# Patient Record
Sex: Female | Born: 1955
Health system: Southern US, Community
[De-identification: ages and names within clinical notes are randomized; demographics above are authoritative.]

## PROBLEM LIST (undated history)

## (undated) DIAGNOSIS — I10 Essential (primary) hypertension: Secondary | ICD-10-CM

## (undated) HISTORY — PX: ABDOMINAL HYSTERECTOMY: SHX81

---

## 1998-03-18 ENCOUNTER — Emergency Department (HOSPITAL_COMMUNITY): Admission: EM | Admit: 1998-03-18 | Discharge: 1998-03-18 | Payer: Self-pay | Admitting: Emergency Medicine

## 1998-12-27 ENCOUNTER — Emergency Department (HOSPITAL_COMMUNITY): Admission: EM | Admit: 1998-12-27 | Discharge: 1998-12-27 | Payer: Self-pay | Admitting: Emergency Medicine

## 2001-05-23 ENCOUNTER — Emergency Department (HOSPITAL_COMMUNITY): Admission: EM | Admit: 2001-05-23 | Discharge: 2001-05-23 | Payer: Self-pay | Admitting: Emergency Medicine

## 2001-05-23 ENCOUNTER — Encounter: Payer: Self-pay | Admitting: Emergency Medicine

## 2003-05-11 ENCOUNTER — Emergency Department (HOSPITAL_COMMUNITY): Admission: EM | Admit: 2003-05-11 | Discharge: 2003-05-11 | Payer: Self-pay | Admitting: Emergency Medicine

## 2004-07-01 ENCOUNTER — Encounter: Admission: RE | Admit: 2004-07-01 | Discharge: 2004-07-01 | Payer: Self-pay | Admitting: Occupational Medicine

## 2004-07-17 ENCOUNTER — Other Ambulatory Visit: Admission: RE | Admit: 2004-07-17 | Discharge: 2004-07-17 | Payer: Self-pay | Admitting: Family Medicine

## 2006-01-26 ENCOUNTER — Other Ambulatory Visit: Admission: RE | Admit: 2006-01-26 | Discharge: 2006-01-26 | Payer: Self-pay | Admitting: Family Medicine

## 2006-05-10 ENCOUNTER — Emergency Department (HOSPITAL_COMMUNITY): Admission: EM | Admit: 2006-05-10 | Discharge: 2006-05-10 | Payer: Self-pay | Admitting: Emergency Medicine

## 2007-01-21 ENCOUNTER — Emergency Department (HOSPITAL_COMMUNITY): Admission: EM | Admit: 2007-01-21 | Discharge: 2007-01-21 | Payer: Self-pay | Admitting: Emergency Medicine

## 2007-06-06 ENCOUNTER — Emergency Department (HOSPITAL_COMMUNITY): Admission: EM | Admit: 2007-06-06 | Discharge: 2007-06-06 | Payer: Self-pay | Admitting: Family Medicine

## 2008-11-10 ENCOUNTER — Emergency Department (HOSPITAL_COMMUNITY): Admission: EM | Admit: 2008-11-10 | Discharge: 2008-11-10 | Payer: Self-pay | Admitting: Emergency Medicine

## 2009-03-20 ENCOUNTER — Other Ambulatory Visit: Admission: RE | Admit: 2009-03-20 | Discharge: 2009-03-20 | Payer: Self-pay | Admitting: Internal Medicine

## 2009-04-21 ENCOUNTER — Observation Stay (HOSPITAL_COMMUNITY): Admission: EM | Admit: 2009-04-21 | Discharge: 2009-04-21 | Payer: Self-pay | Admitting: Emergency Medicine

## 2009-08-02 ENCOUNTER — Encounter: Admission: RE | Admit: 2009-08-02 | Discharge: 2009-08-02 | Payer: Self-pay | Admitting: Internal Medicine

## 2009-10-08 ENCOUNTER — Emergency Department (HOSPITAL_COMMUNITY): Admission: EM | Admit: 2009-10-08 | Discharge: 2009-10-08 | Payer: Self-pay | Admitting: Emergency Medicine

## 2011-05-28 ENCOUNTER — Emergency Department (HOSPITAL_COMMUNITY)
Admission: EM | Admit: 2011-05-28 | Discharge: 2011-05-28 | Disposition: A | Discharge status: Home / Self Care | Payer: BC Managed Care – PPO | Attending: Emergency Medicine | Admitting: Emergency Medicine

## 2011-05-28 ENCOUNTER — Encounter (HOSPITAL_COMMUNITY): Payer: Self-pay

## 2011-05-28 DIAGNOSIS — R221 Localized swelling, mass and lump, neck: Secondary | ICD-10-CM | POA: Insufficient documentation

## 2011-05-28 DIAGNOSIS — R1310 Dysphagia, unspecified: Secondary | ICD-10-CM | POA: Insufficient documentation

## 2011-05-28 DIAGNOSIS — R220 Localized swelling, mass and lump, head: Secondary | ICD-10-CM | POA: Insufficient documentation

## 2011-05-28 DIAGNOSIS — T783XXA Angioneurotic edema, initial encounter: Secondary | ICD-10-CM | POA: Insufficient documentation

## 2011-05-28 DIAGNOSIS — T50995A Adverse effect of other drugs, medicaments and biological substances, initial encounter: Secondary | ICD-10-CM | POA: Insufficient documentation

## 2011-05-28 DIAGNOSIS — I10 Essential (primary) hypertension: Secondary | ICD-10-CM | POA: Insufficient documentation

## 2011-05-28 HISTORY — DX: Essential (primary) hypertension: I10

## 2011-05-28 MED ORDER — DIPHENHYDRAMINE HCL 50 MG/ML IJ SOLN
25.0000 mg | Freq: Once | INTRAMUSCULAR | Status: AC
  Administered 2011-05-28: 25 mg via INTRAVENOUS
  Filled 2011-05-28: qty 1

## 2011-05-28 MED ORDER — FAMOTIDINE 20 MG PO TABS: ORAL_TABLET | ORAL | Status: DC

## 2011-05-28 MED ORDER — PREDNISONE 20 MG PO TABS: 60.0000 mg | ORAL_TABLET | Freq: Every day | ORAL | Status: AC

## 2011-05-28 MED ORDER — DIPHENHYDRAMINE HCL 25 MG PO CAPS: ORAL_CAPSULE | ORAL | Status: DC

## 2011-05-28 MED ORDER — SODIUM CHLORIDE 0.9 % IV SOLN
Freq: Once | INTRAVENOUS | Status: AC
  Administered 2011-05-28: 08:00:00 via INTRAVENOUS

## 2011-05-28 MED ORDER — METHYLPREDNISOLONE SODIUM SUCC 125 MG IJ SOLR
125.0000 mg | Freq: Once | INTRAMUSCULAR | Status: AC
  Administered 2011-05-28: 125 mg via INTRAVENOUS
  Filled 2011-05-28: qty 2

## 2011-05-28 MED ORDER — FAMOTIDINE IN NACL 20-0.9 MG/50ML-% IV SOLN
20.0000 mg | Freq: Once | INTRAVENOUS | Status: AC
  Administered 2011-05-28: 20 mg via INTRAVENOUS
  Filled 2011-05-28: qty 50

## 2011-05-28 NOTE — ED Notes (Signed)
Pt reports woke up this morning at 0430 with angioedema. Reports taking benadryl with no relief. Denying any trouble breathing. Reporting some difficulty swallowing, has spit cup in her hand. Taking bp medication but doesn't remember what kind. Reports angioedema has happened 4 times before. Pt talking responding appropriately. Maintaining oxygen saturation. Airway intact.

## 2011-05-28 NOTE — ED Provider Notes (Signed)
History     CSN: [id number]  Arrival date & time 05/28/11  0719   First MD Initiated Contact with Patient 05/28/11 [phone]      Chief Complaint  Patient presents with  . Angioedema    (Consider location/radiation/quality/duration/timing/severity/associated sxs/prior treatment) Patient is a 56 y.o. female presenting with allergic reaction. The history is provided by the patient.  Allergic Reaction The primary symptoms are  angioedema. The primary symptoms do not include wheezing, shortness of breath, vomiting, rash or urticaria. The current episode started 3 to 5 hours ago.  The angioedema has been gradually worsening since its onset. It is located on the tongue. The angioedema is not associated with shortness of breath or stridor.   Associated symptoms comments: She reports history of multiple episodes of tongue swelling that is usually medication related. She denies ACE inhibitor use, but reports allergy to ibuprofen with previous tongue swelling. No SOB. She is currently spitting saliva into cup because of difficulty swallowing. No difficulty talking or breathing..    Past Medical History  Diagnosis Date  . Hypertension     History reviewed. No pertinent past surgical history.  No family history on file.  History  Substance Use Topics  . Smoking status: Never Smoker   . Smokeless tobacco: Not on file  . Alcohol Use: No    OB History    Grav Para Term Preterm Abortions TAB SAB Ect Mult Living                  Review of Systems  Constitutional: Negative for fever and chills.  HENT: Positive for trouble swallowing. Negative for voice change.        C/O tongue swelling.  Respiratory: Negative.  Negative for shortness of breath, wheezing and stridor.   Cardiovascular: Negative.   Gastrointestinal: Negative.  Negative for vomiting.  Musculoskeletal: Negative.   Skin: Negative.  Negative for rash.  Neurological: Negative.     Allergies  Ibuprofen  Home Medications     Current Outpatient Rx  Name Route Sig Dispense Refill  . DIPHENHYDRAMINE HCL 25 MG PO TABS Oral Take 25 mg by mouth every 6 (six) hours as needed. For allergic reaction      BP 161/98  Pulse 70  Temp(Src) 98.5 F (36.9 C) (Oral)  Resp 20  SpO2 96%  Physical Exam  Constitutional: She appears well-developed and well-nourished.  HENT:  Head: Normocephalic.  Mouth/Throat: Uvula is midline and mucous membranes are normal. Mucous membranes are not dry.       Tongue swelling, left side greater than right.  Neck: Normal range of motion. Neck supple.  Cardiovascular: Normal rate and regular rhythm.   Pulmonary/Chest: Effort normal and breath sounds normal. She has no wheezes. She has no rales.       No stridor.  Abdominal: Soft. Bowel sounds are normal. There is no tenderness. There is no rebound and no guarding.  Musculoskeletal: Normal range of motion.  Neurological: She is alert. No cranial nerve deficit.  Skin: Skin is warm and dry. No rash noted.  Psychiatric: She has a normal mood and affect.    ED Course  Procedures (including critical care time)  Labs Reviewed - No data to display No results found.   No diagnosis found.    MDM  The patient took Coricidin last night. She has been advised to list as an allergy. She has improved here with almost complete resolution of swelling.         Melvenia Beam  Sueanne Margarita, PA-C 05/28/11 [phone]

## 2011-05-28 NOTE — ED Provider Notes (Signed)
Medical screening examination/treatment/procedure(s) were conducted as a shared visit with non-physician practitioner(s) and myself.  I personally evaluated the patient during the encounter  Pt with angioedema of the tongue.  She states this has happened four times in the past.  She states she was previously told it was from medications however she has not had any formal allergy type testing.  She denies family history of same.  No complaints of wheezing, shortness of breath.  Lungs CTA. Angioedema of tongue.  Posterior pharynx norma.  No swelling externally.  Will treat with steroids, antihistamines and reassess.  Celene Kras, MD 05/28/11 [phone]

## 2011-05-28 NOTE — ED Notes (Signed)
Pt presents with onset of swelling to tongue since 0430 this morning.  Pt reports she is able to handle secretions, denies any lip swelling.

## 2011-05-28 NOTE — Discharge Instructions (Signed)
CALL DR. SHARMA TO ARRANGE AN APPOINTMENT FOR FURTHER EVALUATION AND MANAGEMENT OF RECURRENT ALLERGIC TYPE REACTION. RETURN HERE AS NEEDED FOR SIMILAR OR WORSENING SYMPTOMS. TAKE MEDICATIONS AS PRESCRIBED.  Angioedema Angioedema (AE) is sudden puffiness (swelling) of the skin. It can happen to the face, genitals, and other body parts. You may be reacting to something you are sensitive to (allergic reaction). It may have been passed to you from your parents (hereditary), or it may develop by itself (acquired). You may also get red itchy patches of skin (hives). Attacks may be mild. Some attacks are life-threatening. Most often, the puffiness happens fast. It often gets better in 24 to 48 hours.  HOME CARE  Carry your emergency allergy medicines with you.   Wear a medical bracelet.   Avoid things that you know will cause this reaction (triggers).  GET HELP RIGHT AWAY IF:   You have trouble breathing.   You have trouble swallowing.   You pass out (faint).   You have another attack.   Your attacks happen more often or get worse.   AE was passed to you by your parents and you want to start having children.  MAKE SURE YOU:   Understand these instructions.   Will watch your condition.   Will get help right away if you are not doing well or get worse.  Document Released: 02/25/2009 Document Revised: 02/26/2011 Document Reviewed: 02/25/2009 [facility] Patient Information 2012 [place], [place].

## 2011-06-19 ENCOUNTER — Ambulatory Visit
Admission: RE | Admit: 2011-06-19 | Discharge: 2011-06-19 | Disposition: A | Discharge status: Home / Self Care | Payer: BC Managed Care – PPO | Source: Ambulatory Visit | Attending: Allergy | Admitting: Allergy

## 2011-06-19 ENCOUNTER — Other Ambulatory Visit: Payer: Self-pay | Admitting: Allergy

## 2011-06-19 DIAGNOSIS — J45909 Unspecified asthma, uncomplicated: Secondary | ICD-10-CM

## 2011-10-02 ENCOUNTER — Emergency Department (HOSPITAL_COMMUNITY)
Admission: EM | Admit: 2011-10-02 | Discharge: 2011-10-02 | Disposition: A | Discharge status: Home / Self Care | Payer: BC Managed Care – PPO | Source: Home / Self Care

## 2011-10-02 ENCOUNTER — Encounter (HOSPITAL_COMMUNITY): Payer: Self-pay | Admitting: Emergency Medicine

## 2011-10-02 DIAGNOSIS — T783XXA Angioneurotic edema, initial encounter: Secondary | ICD-10-CM

## 2011-10-02 MED ORDER — EPINEPHRINE 0.3 MG/0.3ML IJ DEVI: 0.3000 mg | Freq: Once | INTRAMUSCULAR | Status: DC

## 2011-10-02 MED ORDER — TRIAMCINOLONE ACETONIDE 40 MG/ML IJ SUSP
INTRAMUSCULAR | Status: AC
  Filled 2011-10-02: qty 5

## 2011-10-02 MED ORDER — PREDNISONE 50 MG PO TABS: ORAL_TABLET | ORAL | Status: AC

## 2011-10-02 MED ORDER — RANITIDINE HCL 150 MG PO TABS: 150.0000 mg | ORAL_TABLET | Freq: Two times a day (BID) | ORAL | Status: DC

## 2011-10-02 MED ORDER — KETOROLAC TROMETHAMINE 60 MG/2ML IM SOLN
INTRAMUSCULAR | Status: AC
  Filled 2011-10-02: qty 2

## 2011-10-02 MED ORDER — TRIAMCINOLONE ACETONIDE 40 MG/ML IJ SUSP
60.0000 mg | Freq: Once | INTRAMUSCULAR | Status: AC
  Administered 2011-10-02: 60 mg via INTRAMUSCULAR

## 2011-10-02 NOTE — ED Notes (Signed)
PT HERE WITH SUDDEN OVERNIGHT UPPER LIP SWELLING WITH ITCH.NO NEW FOOD EATEN OR KNOWN ALLERGY.POSS BITE.DENIES PAIN.TRIED USING ICE BUT MADE WORSE IN SWELLING

## 2011-10-02 NOTE — ED Provider Notes (Signed)
History     CSN: [id number]  Arrival date & time 10/02/11  1433   None     Chief Complaint  Patient presents with  . Oral Swelling    (Consider location/radiation/quality/duration/timing/severity/associated sxs/prior treatment) The history is provided by the patient.  patient reports one week ago after consuming drink from [employer] she began to have some throat itching.  States she immediately took benadryl for symptoms in addition to ranitidine.  State this morning she woke up with swelling of her upper lip associated of localized burning sensation and despite continuing to take benadryl/ranitidine combo lip is getting progressively worse.  -feelings of throat closing, -trouble breathing, -voice changes, -abdominal pain, -rash.  Reports she has had same recurrent episodes occur within the last three years, follow up with allergist revealed dust mite allergens only.  Denies new medications, new personal hygiene products, no new foods/drinks, has not been in a new environment.   Past Medical History  Diagnosis Date  . Hypertension   . Asthma     History reviewed. No pertinent past surgical history.  No family history on file.  History  Substance Use Topics  . Smoking status: Never Smoker   . Smokeless tobacco: Not on file  . Alcohol Use: No    OB History    Grav Para Term Preterm Abortions TAB SAB Ect Mult Living                  Review of Systems  All other systems reviewed and are negative.    Allergies  Ibuprofen  Home Medications   Current Outpatient Rx  Name Route Sig Dispense Refill  . AMLODIPINE BESYLATE 5 MG PO TABS Oral Take 5 mg by mouth daily.    [name] HYDROCHLOROTHIAZIDE 12.5 MG PO TABS Oral Take 12.5 mg by mouth daily.    [name] PRAVASTATIN SODIUM 40 MG PO TABS Oral Take 40 mg by mouth daily.    [name] DIPHENHYDRAMINE HCL 25 MG PO CAPS  Take 25 mg every 4 hours for 3 days then as needed for recurrent symptoms. 30 capsule 0  . DIPHENHYDRAMINE HCL 25 MG PO TABS  Oral Take 25 mg by mouth every 6 (six) hours as needed. For allergic reaction    . FAMOTIDINE 20 MG PO TABS  Use one tablet twice daily for 3 days then as needed for recurrent swelling. 30 tablet 0  . PREDNISONE 50 MG PO TABS  Take one tab by mouth daily 5 tablet 0  . RANITIDINE HCL 150 MG PO TABS Oral Take 1 tablet (150 mg total) by mouth 2 (two) times daily. 60 tablet 1    BP 164/84  Pulse 76  Temp 98.5 F (36.9 C) (Oral)  Resp 14  SpO2 97%  Physical Exam  Nursing note and vitals reviewed. Constitutional: She is oriented to person, place, and time. Vital signs are normal. She appears well-developed and well-nourished. She is active and cooperative.  HENT:  Head: Normocephalic.  Right Ear: Hearing, tympanic membrane, external ear and ear canal normal.  Left Ear: Hearing, tympanic membrane, external ear and ear canal normal.  Nose: Nose normal.  Mouth/Throat: Uvula is midline, oropharynx is clear and moist and mucous membranes are normal.       Upper lip angioedema  Eyes: Conjunctivae and EOM are normal. Pupils are equal, round, and reactive to light. No scleral icterus.  Neck: Trachea normal and normal range of motion. Neck supple.  Cardiovascular: Normal rate, regular rhythm, normal heart sounds and  normal pulses.   Pulmonary/Chest: Effort normal and breath sounds normal.  Abdominal: Normal appearance and bowel sounds are normal. There is no hepatosplenomegaly. There is no tenderness.  Lymphadenopathy:    She has no cervical adenopathy.  Neurological: She is alert and oriented to person, place, and time. She has normal strength. No cranial nerve deficit or sensory deficit. GCS eye subscore is 4. GCS verbal subscore is 5. GCS motor subscore is 6.  Skin: Skin is warm and dry. No rash noted.  Psychiatric: She has a normal mood and affect. Her speech is normal and behavior is normal. Judgment and thought content normal. Cognition and memory are normal.    ED Course  Procedures  (including critical care time)  Labs Reviewed - No data to display No results found.   1. Angioedema of lips       MDM  Zyrtec daily in addition to medications as prescribed.  You may need additional testing that will determine if you have a condition that will predispose you similar episodes in the future.   EpiPen as needed with prompt call to EMS.            Johnsie Kindred, NP 10/02/11 1732

## 2011-10-03 NOTE — ED Provider Notes (Signed)
Medical screening examination/treatment/procedure(s) were performed by non-physician practitioner and as supervising physician I was immediately available for consultation/collaboration.  Luiz Blare MD   Luiz Blare, MD 10/03/11 [phone]

## 2011-10-18 ENCOUNTER — Encounter (HOSPITAL_COMMUNITY): Payer: Self-pay | Admitting: *Deleted

## 2011-10-18 ENCOUNTER — Observation Stay (HOSPITAL_COMMUNITY)
Admission: EM | Admit: 2011-10-18 | Discharge: 2011-10-18 | Disposition: A | Discharge status: Home / Self Care | Payer: BC Managed Care – PPO | Attending: Emergency Medicine | Admitting: Emergency Medicine

## 2011-10-18 DIAGNOSIS — R220 Localized swelling, mass and lump, head: Principal | ICD-10-CM | POA: Insufficient documentation

## 2011-10-18 DIAGNOSIS — I10 Essential (primary) hypertension: Secondary | ICD-10-CM | POA: Insufficient documentation

## 2011-10-18 DIAGNOSIS — J45909 Unspecified asthma, uncomplicated: Secondary | ICD-10-CM | POA: Insufficient documentation

## 2011-10-18 LAB — CBC
MCHC: 32.3 g/dL (ref 30.0–36.0)
RDW: 13.4 % (ref 11.5–15.5)

## 2011-10-18 LAB — BASIC METABOLIC PANEL
GFR calc Af Amer: >90 mL/min (ref >90)
GFR calc non Af Amer: >90 mL/min (ref >90)
Potassium: 3.1 mEq/L — ABNORMAL LOW (ref 3.5–5.1)
Sodium: 142 mEq/L (ref 135–145)

## 2011-10-18 MED ORDER — ONDANSETRON HCL 4 MG/2ML IJ SOLN: 4.0000 mg | Freq: Four times a day (QID) | INTRAMUSCULAR | Status: DC | PRN

## 2011-10-18 MED ORDER — DIPHENHYDRAMINE HCL 50 MG/ML IJ SOLN: 25.0000 mg | Freq: Four times a day (QID) | INTRAMUSCULAR | Status: DC | PRN

## 2011-10-18 MED ORDER — METHYLPREDNISOLONE SODIUM SUCC 40 MG IJ SOLR
40.0000 mg | Freq: Four times a day (QID) | INTRAMUSCULAR | Status: DC
  Administered 2011-10-18: 40 mg via INTRAVENOUS
  Filled 2011-10-18: qty 1

## 2011-10-18 MED ORDER — FAMOTIDINE IN NACL 20-0.9 MG/50ML-% IV SOLN
20.0000 mg | Freq: Once | INTRAVENOUS | Status: AC
  Administered 2011-10-18: 20 mg via INTRAVENOUS
  Filled 2011-10-18: qty 50

## 2011-10-18 MED ORDER — PREDNISONE 20 MG PO TABS: 40.0000 mg | ORAL_TABLET | Freq: Every day | ORAL | Status: AC

## 2011-10-18 MED ORDER — EPINEPHRINE 0.3 MG/0.3ML IJ DEVI
0.3000 mg | Freq: Once | INTRAMUSCULAR | Status: AC
  Administered 2011-10-18: 0.3 mg via INTRAMUSCULAR
  Filled 2011-10-18: qty 0.3

## 2011-10-18 MED ORDER — DIPHENHYDRAMINE HCL 25 MG PO TABS: 25.0000 mg | ORAL_TABLET | Freq: Four times a day (QID) | ORAL | Status: AC

## 2011-10-18 MED ORDER — DIPHENHYDRAMINE HCL 50 MG/ML IJ SOLN
12.5000 mg | Freq: Once | INTRAMUSCULAR | Status: AC
  Administered 2011-10-18: 12.5 mg via INTRAVENOUS
  Filled 2011-10-18: qty 1

## 2011-10-18 MED ORDER — METHYLPREDNISOLONE SODIUM SUCC 125 MG IJ SOLR
125.0000 mg | Freq: Once | INTRAMUSCULAR | Status: AC
  Administered 2011-10-18: 125 mg via INTRAVENOUS
  Filled 2011-10-18: qty 2

## 2011-10-18 MED ORDER — FAMOTIDINE IN NACL 20-0.9 MG/50ML-% IV SOLN
20.0000 mg | Freq: Two times a day (BID) | INTRAVENOUS | Status: DC
  Administered 2011-10-18: 20 mg via INTRAVENOUS
  Filled 2011-10-18: qty 50

## 2011-10-18 NOTE — ED Provider Notes (Signed)
History     CSN: [id number]  Arrival date & time 10/18/11  0151   First MD Initiated Contact with Patient 10/18/11 0256      Chief Complaint  Patient presents with  . Oral Swelling    (Consider location/radiation/quality/duration/timing/severity/associated sxs/prior treatment) HPI  Past Medical History  Diagnosis Date  . Hypertension   . Asthma     History reviewed. No pertinent past surgical history.  History reviewed. No pertinent family history.  History  Substance Use Topics  . Smoking status: Never Smoker   . Smokeless tobacco: Not on file  . Alcohol Use: No    OB History    Grav Para Term Preterm Abortions TAB SAB Ect Mult Living                  Review of Systems  Allergies  Ibuprofen  Home Medications   Current Outpatient Rx  Name Route Sig Dispense Refill  . AMLODIPINE BESYLATE 5 MG PO TABS Oral Take 5 mg by mouth daily.    [name] DIPHENHYDRAMINE HCL 25 MG PO TABS Oral Take 25 mg by mouth every 6 (six) hours as needed. For allergic reaction    . FAMOTIDINE 20 MG PO TABS  Use one tablet twice daily for 3 days then as needed for recurrent swelling. 30 tablet 0  . HYDROCHLOROTHIAZIDE 12.5 MG PO TABS Oral Take 12.5 mg by mouth daily.    [name] PRAVASTATIN SODIUM 40 MG PO TABS Oral Take 40 mg by mouth daily.    [name] RANITIDINE HCL 150 MG PO TABS Oral Take 1 tablet (150 mg total) by mouth 2 (two) times daily. 60 tablet 1  . EPINEPHRINE 0.3 MG/0.3ML IJ DEVI Intramuscular Inject 0.3 mLs (0.3 mg total) into the muscle once. 1 Device 2    BP 146/78  Pulse 104  Temp 97.9 F (36.6 C) (Oral)  Resp 18  SpO2 100%  Physical Exam  ED Course  Procedures (including critical care time)  Labs Reviewed  CBC - Abnormal; Notable for the following:    WBC 11.3 (*)     All other components within normal limits  BASIC METABOLIC PANEL - Abnormal; Notable for the following:    Potassium 3.1 (*)     Glucose, Bld 140 (*)     All other components within normal limits   No  results found.   1. Tongue swelling       MDM  Received sign-over at 0800.  Pt had swelling/edema isolated to her tongue.  There was no evidence of airway compromise.  Over the last 3 hours the swelling has improved.  She appears comfortable and has normal respiratory effort and is able to swallow without difficulty.  Plan d/c home.  Pt has been advised to avoid [employer] which appears to be the offending agent.  Will also prescribe prn benadryl and a prednisone pulse.  Strongly encouraged close outpt f/u.        Tobin Chad, MD 10/18/11 1101

## 2011-10-18 NOTE — ED Notes (Signed)
Pt states that she ate a hamburger and cinniman roll this evening. Pt went to sleep then woke up with tonge swelling.

## 2011-10-18 NOTE — ED Provider Notes (Signed)
History     CSN: [id number]  Arrival date & time 10/18/11  0151   First MD Initiated Contact with Patient 10/18/11 0256     3:44 AM HPI Patient reports this evening she is a hamburger from [employer] and a cinnamon bun. Reports going to sleep and waking up with swelling of her tongue. Denies difficulty breathing, shortness breath, lip swelling. Denies change in medication. Reports taking a Benadryl feeling as if symptoms are worsened. The greatest patient had similar symptoms earlier this month after having [employer] food. Patient to followup with allergist and patient has no other allergies except from dust mites.   The history is provided by the patient.    Past Medical History  Diagnosis Date  . Hypertension   . Asthma     History reviewed. No pertinent past surgical history.  History reviewed. No pertinent family history.  History  Substance Use Topics  . Smoking status: Never Smoker   . Smokeless tobacco: Not on file  . Alcohol Use: No    OB History    Grav Para Term Preterm Abortions TAB SAB Ect Mult Living                  Review of Systems  Constitutional: Negative for fever, chills and fatigue.  HENT: Positive for trouble swallowing. Negative for sore throat, mouth sores, neck pain and neck stiffness.        Tongue swelling  Respiratory: Negative for cough, shortness of breath and wheezing.   Cardiovascular: Negative for chest pain.  All other systems reviewed and are negative.    Allergies  Ibuprofen  Home Medications   Current Outpatient Rx  Name Route Sig Dispense Refill  . AMLODIPINE BESYLATE 5 MG PO TABS Oral Take 5 mg by mouth daily.    [name] DIPHENHYDRAMINE HCL 25 MG PO TABS Oral Take 25 mg by mouth every 6 (six) hours as needed. For allergic reaction    . FAMOTIDINE 20 MG PO TABS  Use one tablet twice daily for 3 days then as needed for recurrent swelling. 30 tablet 0  . HYDROCHLOROTHIAZIDE 12.5 MG PO TABS Oral Take 12.5 mg by mouth daily.    [name]  PRAVASTATIN SODIUM 40 MG PO TABS Oral Take 40 mg by mouth daily.    [name] RANITIDINE HCL 150 MG PO TABS Oral Take 1 tablet (150 mg total) by mouth 2 (two) times daily. 60 tablet 1  . EPINEPHRINE 0.3 MG/0.3ML IJ DEVI Intramuscular Inject 0.3 mLs (0.3 mg total) into the muscle once. 1 Device 2    BP 190/107  Pulse 94  Temp 97.9 F (36.6 C) (Oral)  Resp 14  SpO2 99%  Physical Exam  Vitals reviewed. Constitutional: She is oriented to person, place, and time. Vital signs are normal. She appears well-developed and well-nourished.  HENT:  Head: Normocephalic and atraumatic.       Patient's oropharynx is clear. Moderate amount of tongue edema. No erythema. Neck is nontender.  Eyes: Conjunctivae are normal. Pupils are equal, round, and reactive to light.  Neck: Normal range of motion. Neck supple.  Cardiovascular: Normal rate, regular rhythm and normal heart sounds.  Exam reveals no friction rub.   No murmur heard. Pulmonary/Chest: Effort normal and breath sounds normal. She has no wheezes. She has no rhonchi. She has no rales. She exhibits no tenderness.  Musculoskeletal: Normal range of motion.  Neurological: She is alert and oriented to person, place, and time. Coordination normal.  Skin: Skin is warm  and dry. No rash noted. No erythema. No pallor.    ED Course  Procedures  MDM    It has been observed for 4 hours in ED. Symptoms have not worsened or improved. Will place in CDU allergic reaction protocol. Patient does not have difficulty with breathing, throat irritation, shortness of breath, or cough. However she does have moderate amount of tongue swelling. I feel this should be watched in ED longer to ensure no worsening. Discussed patient with Dr. Gwen Her, PA-C 10/18/11 [phone]

## 2011-10-18 NOTE — ED Provider Notes (Signed)
Medical screening examination/treatment/procedure(s) were conducted as a shared visit with non-physician practitioner(s) and myself.  I personally evaluated the patient during the encounter.  Pt with h/o angioedema in the past, has seen allergist without clear cause of same.  Usually only lips, but woke around 130 am with tongue edema.  No sob, difficulties swallowing.  No progression of swelling.  To be observed in cdu on allergy protocol.  Olivia Mackie, MD 10/18/11 [phone]

## 2013-01-05 ENCOUNTER — Emergency Department (INDEPENDENT_AMBULATORY_CARE_PROVIDER_SITE_OTHER): Payer: BC Managed Care – PPO

## 2013-01-05 ENCOUNTER — Emergency Department (HOSPITAL_COMMUNITY)
Admission: EM | Admit: 2013-01-05 | Discharge: 2013-01-05 | Disposition: A | Discharge status: Home / Self Care | Payer: BC Managed Care – PPO | Source: Home / Self Care | Attending: Family Medicine | Admitting: Family Medicine

## 2013-01-05 ENCOUNTER — Encounter (HOSPITAL_COMMUNITY): Payer: Self-pay | Admitting: Emergency Medicine

## 2013-01-05 DIAGNOSIS — J069 Acute upper respiratory infection, unspecified: Secondary | ICD-10-CM

## 2013-01-05 LAB — POCT RAPID STREP A: Streptococcus, Group A Screen (Direct): NEGATIVE

## 2013-01-05 MED ORDER — IPRATROPIUM BROMIDE 0.06 % NA SOLN: 2.0000 | Freq: Four times a day (QID) | NASAL | Status: DC

## 2013-01-05 MED ORDER — MINOCYCLINE HCL 100 MG PO CAPS: 100.0000 mg | ORAL_CAPSULE | Freq: Two times a day (BID) | ORAL | Status: DC

## 2013-01-05 NOTE — ED Provider Notes (Signed)
CSN: [id number]     Arrival date & time 01/05/13  1757 History   First MD Initiated Contact with Patient 01/05/13 1858     Chief Complaint  Patient presents with  . Sore Throat  . Cough   (Consider location/radiation/quality/duration/timing/severity/associated sxs/prior Treatment) Patient is a 57 y.o. female presenting with pharyngitis and cough. The history is provided by the patient.  Sore Throat This is a new problem. The current episode started more than 1 week ago. The problem has been gradually worsening. Associated symptoms include chest pain. Associated symptoms comments: Assoc with coughing.  Cough Associated symptoms: chest pain and rhinorrhea     Past Medical History  Diagnosis Date  . Hypertension   . Asthma    History reviewed. No pertinent past surgical history. History reviewed. No pertinent family history. History  Substance Use Topics  . Smoking status: Never Smoker   . Smokeless tobacco: Not on file  . Alcohol Use: No   OB History   Grav Para Term Preterm Abortions TAB SAB Ect Mult Living                 Review of Systems  Constitutional: Negative.   HENT: Positive for congestion, postnasal drip and rhinorrhea.   Respiratory: Positive for cough.   Cardiovascular: Positive for chest pain.  Gastrointestinal: Negative.     Allergies  Ibuprofen  Home Medications   Current Outpatient Rx  Name  Route  Sig  Dispense  Refill  . amLODipine (NORVASC) 5 MG tablet   Oral   Take 5 mg by mouth daily.         . diphenhydrAMINE (BENADRYL) 25 MG tablet   Oral   Take 25 mg by mouth every 6 (six) hours as needed. For allergic reaction         . EXPIRED: diphenhydrAMINE (BENADRYL) 25 MG tablet   Oral   Take 1 tablet (25 mg total) by mouth every 6 (six) hours.   20 tablet   0   . EPINEPHrine (EPIPEN) 0.3 mg/0.3 mL DEVI   Intramuscular   Inject 0.3 mLs (0.3 mg total) into the muscle once.   1 Device   2   . famotidine (PEPCID) 20 MG tablet     Use one tablet twice daily for 3 days then as needed for recurrent swelling.   30 tablet   0   . hydrochlorothiazide (HYDRODIURIL) 12.5 MG tablet   Oral   Take 12.5 mg by mouth daily.         [name] ipratropium (ATROVENT) 0.06 % nasal spray   Nasal   Place 2 sprays into the nose 4 (four) times daily.   15 mL   1   . minocycline (MINOCIN,DYNACIN) 100 MG capsule   Oral   Take 1 capsule (100 mg total) by mouth 2 (two) times daily.   20 capsule   0   . pravastatin (PRAVACHOL) 40 MG tablet   Oral   Take 40 mg by mouth daily.         [name] EXPIRED: ranitidine (ZANTAC) 150 MG tablet   Oral   Take 1 tablet (150 mg total) by mouth 2 (two) times daily.   60 tablet   1    BP 208/174  Pulse 81  Temp(Src) 98.8 F (37.1 C) (Oral)  Resp 16  SpO2 100% Physical Exam  Nursing note and vitals reviewed. Constitutional: She is oriented to person, place, and time. She appears well-developed and well-nourished.  HENT:  Head:  Normocephalic.  Right Ear: External ear normal.  Left Ear: External ear normal.  Nose: Mucosal edema and rhinorrhea present.  Neck: Normal range of motion. Neck supple.  Cardiovascular: Regular rhythm and normal heart sounds.   Pulmonary/Chest: Effort normal. No respiratory distress. She has no wheezes. She has rhonchi.  Abdominal: Soft. Bowel sounds are normal.  Lymphadenopathy:    She has no cervical adenopathy.  Neurological: She is alert and oriented to person, place, and time.  Skin: Skin is warm and dry.    ED Course  Procedures (including critical care time) Labs Review Labs Reviewed  POCT RAPID STREP A ([facility] CARE ONLY)   Imaging Review Dg Chest 2 View  01/05/2013   CLINICAL DATA:  57 year old female with cough and sore throat  EXAM: CHEST  2 VIEW  COMPARISON:  06/19/2011 and prior chest radiographs  FINDINGS: The cardiomediastinal silhouette is unremarkable.  Mild peribronchial thickening/ interstitial prominence is unchanged.  There is no evidence  of focal airspace disease, pulmonary edema, suspicious pulmonary nodule/mass, pleural effusion, or pneumothorax. No acute bony abnormalities are identified.  IMPRESSION: No evidence of acute cardiopulmonary disease.  Unchanged mild peribronchial thickening/ interstitial lung disease.   Electronically Signed   By: Laveda Abbe M.D.   On: 01/05/2013 19:39      MDM  X-rays reviewed and report per radiologist.     Linna Hoff, MD 01/05/13 [phone]

## 2013-01-05 NOTE — ED Notes (Signed)
C/o sore throat and chest pain due to coughing.

## 2013-01-07 LAB — CULTURE, GROUP A STREP

## 2013-05-02 ENCOUNTER — Emergency Department (HOSPITAL_COMMUNITY)
Admission: EM | Admit: 2013-05-02 | Discharge: 2013-05-02 | Disposition: A | Discharge status: Home / Self Care | Payer: BC Managed Care – PPO | Source: Home / Self Care | Attending: Family Medicine | Admitting: Family Medicine

## 2013-05-02 ENCOUNTER — Encounter (HOSPITAL_COMMUNITY): Payer: Self-pay | Admitting: Emergency Medicine

## 2013-05-02 DIAGNOSIS — L52 Erythema nodosum: Secondary | ICD-10-CM

## 2013-05-02 LAB — POCT RAPID STREP A: Streptococcus, Group A Screen (Direct): NEGATIVE

## 2013-05-02 MED ORDER — BENZONATATE 200 MG PO CAPS: 200.0000 mg | ORAL_CAPSULE | Freq: Three times a day (TID) | ORAL | Status: DC | PRN

## 2013-05-02 MED ORDER — IPRATROPIUM BROMIDE 0.06 % NA SOLN: 2.0000 | Freq: Four times a day (QID) | NASAL | Status: DC

## 2013-05-02 NOTE — ED Provider Notes (Signed)
CSN: [id number]     Arrival date & time 05/02/13  1419 History   First MD Initiated Contact with Patient 05/02/13 1623     Chief Complaint  Patient presents with  . Cough  . Rash     (Consider location/radiation/quality/duration/timing/severity/associated sxs/prior Treatment) HPI Comments: 58 year old female presents complaining of dry cough, chest congestion, back pain, runny nose. This began 4 days ago. 7 days ago she started to have a red bump that is slightly painful and tender on her right shin. This has started to resolve, but she has had 2 more red bumps show up, one on her left shoulder and one on her right forearm. They feel and look exactly the same as the original rash on her shin. She has never had anything like this happen before and has no other significant medical problems. No fever or sore throat. No chest pain or shortness of breath  Patient is a 58 y.o. female presenting with cough and rash.  Cough Associated symptoms: rash and rhinorrhea   Associated symptoms: no chest pain, no chills, no fever, no myalgias and no shortness of breath   Rash Associated symptoms: fatigue   Associated symptoms: no abdominal pain, no fever, no joint pain, no myalgias, no nausea, no shortness of breath and not vomiting     Past Medical History  Diagnosis Date  . Hypertension   . Asthma    History reviewed. No pertinent past surgical history. History reviewed. No pertinent family history. History  Substance Use Topics  . Smoking status: Never Smoker   . Smokeless tobacco: Not on file  . Alcohol Use: No   OB History   Grav Para Term Preterm Abortions TAB SAB Ect Mult Living                 Review of Systems  Constitutional: Positive for fatigue. Negative for fever and chills.  HENT: Positive for congestion and rhinorrhea.   Eyes: Negative for visual disturbance.  Respiratory: Positive for cough and chest tightness. Negative for shortness of breath.   Cardiovascular: Negative  for chest pain, palpitations and leg swelling.  Gastrointestinal: Negative for nausea, vomiting and abdominal pain.  Endocrine: Negative for polydipsia and polyuria.  Genitourinary: Negative for dysuria, urgency and frequency.  Musculoskeletal: Positive for back pain. Negative for arthralgias and myalgias.  Skin: Positive for rash.  Neurological: Negative for dizziness, weakness and light-headedness.      Allergies  Ibuprofen  Home Medications   Current Outpatient Rx  Name  Route  Sig  Dispense  Refill  . amLODipine (NORVASC) 5 MG tablet   Oral   Take 5 mg by mouth daily.         . benzonatate (TESSALON) 200 MG capsule   Oral   Take 1 capsule (200 mg total) by mouth 3 (three) times daily as needed for cough.   20 capsule   0   . diphenhydrAMINE (BENADRYL) 25 MG tablet   Oral   Take 25 mg by mouth every 6 (six) hours as needed. For allergic reaction         . EXPIRED: diphenhydrAMINE (BENADRYL) 25 MG tablet   Oral   Take 1 tablet (25 mg total) by mouth every 6 (six) hours.   20 tablet   0   . EPINEPHrine (EPIPEN) 0.3 mg/0.3 mL DEVI   Intramuscular   Inject 0.3 mLs (0.3 mg total) into the muscle once.   1 Device   2   . famotidine (PEPCID) 20  MG tablet      Use one tablet twice daily for 3 days then as needed for recurrent swelling.   30 tablet   0   . hydrochlorothiazide (HYDRODIURIL) 12.5 MG tablet   Oral   Take 12.5 mg by mouth daily.         [name] ipratropium (ATROVENT) 0.06 % nasal spray   Nasal   Place 2 sprays into the nose 4 (four) times daily.   15 mL   1   . ipratropium (ATROVENT) 0.06 % nasal spray   Each Nare   Place 2 sprays into both nostrils 4 (four) times daily.   15 mL   12   . minocycline (MINOCIN,DYNACIN) 100 MG capsule   Oral   Take 1 capsule (100 mg total) by mouth 2 (two) times daily.   20 capsule   0   . pravastatin (PRAVACHOL) 40 MG tablet   Oral   Take 40 mg by mouth daily.         [name] EXPIRED: ranitidine (ZANTAC)  150 MG tablet   Oral   Take 1 tablet (150 mg total) by mouth 2 (two) times daily.   60 tablet   1    BP 189/99  Pulse 78  Temp(Src) 98.2 F (36.8 C) (Oral)  Resp 14  SpO2 98% Physical Exam  Nursing note and vitals reviewed. Constitutional: She is oriented to person, place, and time. Vital signs are normal. She appears well-developed and well-nourished. No distress.  HENT:  Head: Normocephalic and atraumatic.  Right Ear: Tympanic membrane, external ear and ear canal normal.  Left Ear: Tympanic membrane, external ear and ear canal normal.  Nose: Nose normal. Right sinus exhibits no maxillary sinus tenderness and no frontal sinus tenderness. Left sinus exhibits no maxillary sinus tenderness and no frontal sinus tenderness.  Mouth/Throat: Uvula is midline, oropharynx is clear and moist and mucous membranes are normal. No oropharyngeal exudate.  Eyes: Conjunctivae are normal. Right eye exhibits no discharge. Left eye exhibits no discharge.  Neck: Normal range of motion. Neck supple.  Cardiovascular: Normal rate, regular rhythm and normal heart sounds.  Exam reveals no gallop and no friction rub.   No murmur heard. Pulmonary/Chest: Effort normal and breath sounds normal. No respiratory distress. She has no wheezes. She has no rales.  Lymphadenopathy:    She has no cervical adenopathy.  Neurological: She is alert and oriented to person, place, and time. She has normal strength. Coordination normal.  Skin: Skin is warm and dry. Rash noted. Rash is nodular (raised, 2-1/2 cm circular, erythematous, indurated, nodules on the right forearm, and 2 on the left shoulder. The area she described on her right shin has resolved and his left a discolored, ecchymotic area with a central brown papule). She is not diaphoretic.  Psychiatric: She has a normal mood and affect. Judgment normal.    ED Course  Procedures (including critical care time) Labs Review Labs Reviewed  POCT RAPID STREP A ([facility]  CARE ONLY)   Imaging Review No results found.    MDM   Final diagnoses:  Erythema nodosum    This rash is the appearance of erythema nodosum, although it is atypical distribution. This is most likely a self-limited upper respiratory viral syndrome. Will prescribe Atrovent nasal spray for the runny nose and Tessalon perles for cough, and advised her to stop taking any of the over-the-counter medications she was taking before. She will attempt to give this 2 weeks to resolve completely, otherwise  she will return if she has any worsening in her condition or if this does not resolve.   Meds ordered this encounter  Medications  . ipratropium (ATROVENT) 0.06 % nasal spray    Sig: Place 2 sprays into both nostrils 4 (four) times daily.    Dispense:  15 mL    Refill:  12    Order Specific Question:  Supervising Provider    Answer:  Linna Hoff [phone]  . benzonatate (TESSALON) 200 MG capsule    Sig: Take 1 capsule (200 mg total) by mouth 3 (three) times daily as needed for cough.    Dispense:  20 capsule    Refill:  0    Order Specific Question:  Supervising Provider    Answer:  Bradd Canary D [[id number]]       Graylon Good, PA-C 05/02/13 [id number]

## 2013-05-02 NOTE — ED Notes (Signed)
C/o cold sx that started 4 days ago that include chest congestion, non productive cough, and back pain. Pain is 8/10. Pt has red bumps on arms and chest that started on Sunday. Stated that a bump was on her leg 1 week ago, but went away. Written by: Marga MelnickQuaNeisha Jones, SMA

## 2013-05-02 NOTE — Discharge Instructions (Signed)
Erythema Nodosum  Erythema nodosum is also called "painful red nodules on the legs". Symptoms can include sudden onset of fever, fatigue and joint pains. Red, deep, tender, raised, bruise-like bumps form on the shin, or sometimes on the arms or trunk. The skin over the bumps is usually shiny. The symptoms usually last 1-2 weeks. The symptoms usually improve once the cause is treated. This illness may be caused by strep or fungal infection, drug reaction, pregnancy, or other medical conditions. Treating the cause is important to early recovery. Erythema nodosum occurs at any age and in both sexes but more often in young adult women.  Erythema nodosum is not a skin infection. It is a reaction to something internal. In most cases the illness and bumps go away with treatment. You should avoid any medicine that may have caused this reaction. Antihistamine drugs, anti-inflammatory drugs or cortisone medicine may be prescribed to relieve symptoms. SSKI drops ([place]) taken with juice at breakfast, lunch and dinner may have an anti-inflammatory effect and speed the healing. Bed rest and limiting vigorous exercise help to shorten the course of erythema nodosum. Elevation of the affected limb also helps in recovery.  As the condition gets better, the bumps flatten and your skin may heal with temporary bruise marks. These dark marks will clear up in several months and they are a good sign that your skin is healing.   SEEK IMMEDIATE MEDICAL CARE IF:   · Your condition worsens, or if you have more severe symptoms such a high fever, sore throat, or repeated vomiting.  Document Released: 04/16/2004 Document Revised: 06/01/2011 Document Reviewed: 04/20/2008  [employer]® Patient Information ©2014 [employer].

## 2013-05-03 NOTE — ED Provider Notes (Signed)
Medical screening examination/treatment/procedure(s) were performed by resident physician or non-physician practitioner and as supervising physician I was immediately available for consultation/collaboration.   Uriyah Massimo DOUGLAS MD.   Bular Hickok D Griselda Tosh, MD 05/03/13 1956 

## 2013-05-05 LAB — CULTURE, GROUP A STREP

## 2014-08-03 ENCOUNTER — Other Ambulatory Visit: Payer: Self-pay | Admitting: Registered Nurse

## 2014-08-03 DIAGNOSIS — Z1231 Encounter for screening mammogram for malignant neoplasm of breast: Secondary | ICD-10-CM

## 2014-08-08 ENCOUNTER — Ambulatory Visit
Admission: RE | Admit: 2014-08-08 | Discharge: 2014-08-08 | Disposition: A | Discharge status: Home / Self Care | Payer: BLUE CROSS/BLUE SHIELD | Source: Ambulatory Visit | Attending: Registered Nurse | Admitting: Registered Nurse

## 2014-08-08 DIAGNOSIS — Z1231 Encounter for screening mammogram for malignant neoplasm of breast: Secondary | ICD-10-CM

## 2015-06-24 DIAGNOSIS — H401132 Primary open-angle glaucoma, bilateral, moderate stage: Secondary | ICD-10-CM | POA: Diagnosis not present

## 2016-07-24 ENCOUNTER — Encounter (HOSPITAL_COMMUNITY): Payer: Self-pay | Admitting: Emergency Medicine

## 2016-07-24 ENCOUNTER — Emergency Department (HOSPITAL_COMMUNITY)
Admission: EM | Admit: 2016-07-24 | Discharge: 2016-07-24 | Disposition: A | Discharge status: Home / Self Care | Payer: BLUE CROSS/BLUE SHIELD | Attending: Emergency Medicine | Admitting: Emergency Medicine

## 2016-07-24 DIAGNOSIS — Z79899 Other long term (current) drug therapy: Secondary | ICD-10-CM | POA: Insufficient documentation

## 2016-07-24 DIAGNOSIS — J45909 Unspecified asthma, uncomplicated: Secondary | ICD-10-CM | POA: Diagnosis not present

## 2016-07-24 DIAGNOSIS — T7840XA Allergy, unspecified, initial encounter: Secondary | ICD-10-CM | POA: Diagnosis present

## 2016-07-24 DIAGNOSIS — R220 Localized swelling, mass and lump, head: Secondary | ICD-10-CM | POA: Diagnosis not present

## 2016-07-24 DIAGNOSIS — I10 Essential (primary) hypertension: Secondary | ICD-10-CM | POA: Insufficient documentation

## 2016-07-24 MED ORDER — PREDNISONE 10 MG PO TABS: 10.0000 mg | ORAL_TABLET | Freq: Two times a day (BID) | ORAL | 0 refills | Status: DC

## 2016-07-24 MED ORDER — METHYLPREDNISOLONE SODIUM SUCC 125 MG IJ SOLR
125.0000 mg | Freq: Once | INTRAMUSCULAR | Status: AC
  Administered 2016-07-24: 125 mg via INTRAVENOUS
  Filled 2016-07-24: qty 2

## 2016-07-24 NOTE — ED Provider Notes (Signed)
[facility]PT Provider Note   CSN: [id number] Arrival date & time: 07/24/16  5621     History   Chief Complaint Chief Complaint  Patient presents with  . Allergic Reaction  . Oral Swelling    HPI Rachael Smith is a 61 y.o. female.  Patient is a 61 year old female with history of asthma and hypertension. She presents for evaluation of tongue swelling. This started this morning and woke her from sleep. The left side of her tongue has swollen up. She denies any pain. She denies any difficulty breathing but does state that is difficult to swallow. She denies any fevers or chills. She denies any new contacts. She is not currently taking any ACE inhibitor's. She does report drinking Crystal light for the first time this evening.   The history is provided by the patient.  Allergic Reaction  Presenting symptoms: difficulty swallowing   Presenting symptoms: no difficulty breathing   Severity:  Moderate Duration:  2 hours Relieved by:  Nothing Worsened by:  Nothing Ineffective treatments:  Antihistamines   Past Medical History:  Diagnosis Date  . Asthma   . Hypertension     There are no active problems to display for this patient.   No past surgical history on file.  OB History    No data available       Home Medications    Prior to Admission medications   Medication Sig Start Date End Date Taking? Authorizing Provider  amLODipine (NORVASC) 5 MG tablet Take 5 mg by mouth daily.    Historical Provider, MD  benzonatate (TESSALON) 200 MG capsule Take 1 capsule (200 mg total) by mouth 3 (three) times daily as needed for cough. 05/02/13   Graylon Good, PA-C  diphenhydrAMINE (BENADRYL) 25 MG tablet Take 25 mg by mouth every 6 (six) hours as needed. For allergic reaction    Historical Provider, MD  diphenhydrAMINE (BENADRYL) 25 MG tablet Take 1 tablet (25 mg total) by mouth every 6 (six) hours. 10/18/11 11/17/11  Tobin Chad, MD  EPINEPHrine (EPIPEN) 0.3 mg/0.3  mL DEVI Inject 0.3 mLs (0.3 mg total) into the muscle once. 10/02/11   Johnsie Kindred, NP  famotidine (PEPCID) 20 MG tablet Use one tablet twice daily for 3 days then as needed for recurrent swelling. 05/28/11   Elpidio Anis, PA-C  hydrochlorothiazide (HYDRODIURIL) 12.5 MG tablet Take 12.5 mg by mouth daily.    Historical Provider, MD  ipratropium (ATROVENT) 0.06 % nasal spray Place 2 sprays into the nose 4 (four) times daily. 01/05/13   Linna Hoff, MD  ipratropium (ATROVENT) 0.06 % nasal spray Place 2 sprays into both nostrils 4 (four) times daily. 05/02/13   Graylon Good, PA-C  minocycline (MINOCIN,DYNACIN) 100 MG capsule Take 1 capsule (100 mg total) by mouth 2 (two) times daily. 01/05/13   Linna Hoff, MD  pravastatin (PRAVACHOL) 40 MG tablet Take 40 mg by mouth daily.    Historical Provider, MD  ranitidine (ZANTAC) 150 MG tablet Take 1 tablet (150 mg total) by mouth 2 (two) times daily. 10/02/11 10/01/12  Johnsie Kindred, NP    Family History No family history on file.  Social History Social History  Substance Use Topics  . Smoking status: Never Smoker  . Smokeless tobacco: Not on file  . Alcohol use No     Allergies   Ibuprofen   Review of Systems Review of Systems  HENT: Positive for trouble swallowing.   All other systems reviewed and  are negative.    Physical Exam Updated Vital Signs BP (!) 190/105   Pulse 79   Temp 98.5 F (36.9 C)   Resp 18   SpO2 98%   Physical Exam  Constitutional: She is oriented to person, place, and time. She appears well-developed and well-nourished. No distress.  HENT:  Head: Normocephalic and atraumatic.  Mouth/Throat: Oropharynx is clear and moist.  There is asymmetrical swelling of the tongue. The left side of the tongue is enlarged. There is no evidence for trauma. There is no stridor.  Neck: Normal range of motion. Neck supple. No tracheal deviation present.  Cardiovascular: Normal rate and regular rhythm.  Exam reveals no  gallop and no friction rub.   No murmur heard. Pulmonary/Chest: Effort normal and breath sounds normal. No stridor. No respiratory distress. She has no wheezes. She has no rales.  Abdominal: Soft. Bowel sounds are normal. She exhibits no distension. There is no tenderness.  Musculoskeletal: Normal range of motion.  Neurological: She is alert and oriented to person, place, and time.  Skin: Skin is warm and dry. She is not diaphoretic.  Nursing note and vitals reviewed.    ED Treatments / Results  Labs (all labs ordered are listed, but only abnormal results are displayed) Labs Reviewed - No data to display  EKG  EKG Interpretation None       Radiology No results found.  Procedures Procedures (including critical care time)  Medications Ordered in ED Medications  methylPREDNISolone sodium succinate (SOLU-MEDROL) 125 mg/2 mL injection 125 mg (not administered)     Initial Impression / Assessment and Plan / ED Course  I have reviewed the triage vital signs and the nursing notes.  Pertinent labs & imaging results that were available during my care of the patient were reviewed by me and considered in my medical decision making (see chart for details).  Patient presents with complaints of swelling to the left side of her tongue that started in the night. Her only new exposure would be drinking Crystal light. She does not take an ACE inhibitor. She does report having similar episodes in the past, the cause of which she is uncertain. This sounds like and looks clinically like angioedema.  She was given steroids and antihistamines and observed. Her swelling has improved and she is feeling better. She will be discharged with prednisone, Benadryl, and as needed return.  Final Clinical Impressions(s) / ED Diagnoses   Final diagnoses:  None    New Prescriptions New Prescriptions   No medications on file     Geoffery Lyonsouglas Lovie Agresta, MD 07/24/16 [phone]

## 2016-07-24 NOTE — ED Triage Notes (Addendum)
Pt reports woke up about 2 am with her tongue swollen. Pt reports used crystal light for the first time.

## 2016-07-24 NOTE — Discharge Instructions (Signed)
Prednisone as prescribed.  Benadryl 25 mg every 6 hours for the next 3 days.  Return to the emergency department if you develop difficulty breathing, worsening swelling, or an inability to swallow.

## 2016-07-29 DIAGNOSIS — H401131 Primary open-angle glaucoma, bilateral, mild stage: Secondary | ICD-10-CM | POA: Diagnosis not present

## 2016-08-02 ENCOUNTER — Emergency Department (HOSPITAL_COMMUNITY)
Admission: EM | Admit: 2016-08-02 | Discharge: 2016-08-02 | Disposition: A | Discharge status: Home / Self Care | Payer: BLUE CROSS/BLUE SHIELD | Attending: Emergency Medicine | Admitting: Emergency Medicine

## 2016-08-02 ENCOUNTER — Encounter (HOSPITAL_COMMUNITY): Payer: Self-pay | Admitting: Emergency Medicine

## 2016-08-02 DIAGNOSIS — R21 Rash and other nonspecific skin eruption: Secondary | ICD-10-CM | POA: Diagnosis present

## 2016-08-02 DIAGNOSIS — Z79899 Other long term (current) drug therapy: Secondary | ICD-10-CM | POA: Insufficient documentation

## 2016-08-02 DIAGNOSIS — I10 Essential (primary) hypertension: Secondary | ICD-10-CM | POA: Insufficient documentation

## 2016-08-02 DIAGNOSIS — J45909 Unspecified asthma, uncomplicated: Secondary | ICD-10-CM | POA: Insufficient documentation

## 2016-08-02 DIAGNOSIS — T7840XA Allergy, unspecified, initial encounter: Secondary | ICD-10-CM | POA: Diagnosis not present

## 2016-08-02 MED ORDER — PREDNISONE 10 MG PO TABS: 10.0000 mg | ORAL_TABLET | Freq: Two times a day (BID) | ORAL | 0 refills | Status: AC

## 2016-08-02 NOTE — ED Provider Notes (Signed)
[facility] Provider Note   CSN: [id number] Arrival date & time: 08/02/16  9562     History   Chief Complaint Chief Complaint  Patient presents with  . Allergic Reaction    HPI Rachael Smith is a 61 y.o. female.  The history is provided by the patient.  Rash   This is a recurrent problem. The current episode started 6 to 12 hours ago. The problem has been resolved. The problem is associated with an unknown factor. There has been no fever. The fever has been present for less than 1 day. The rash is present on the trunk and groin. The pain is at a severity of 0/10. The patient is experiencing no pain. Associated symptoms include itching. Pertinent negatives include no blisters, no pain and no weeping. She has tried antihistamines for the symptoms. The treatment provided significant relief.    Past Medical History:  Diagnosis Date  . Asthma   . Hypertension     There are no active problems to display for this patient.   Past Surgical History:  Procedure Laterality Date  . ABDOMINAL HYSTERECTOMY      OB History    No data available       Home Medications    Prior to Admission medications   Medication Sig Start Date End Date Taking? Authorizing Provider  benzonatate (TESSALON) 200 MG capsule Take 1 capsule (200 mg total) by mouth 3 (three) times daily as needed for cough. Patient not taking: Reported on 07/24/2016 05/02/13   Graylon Good, PA-C  diphenhydrAMINE (BENADRYL) 25 MG tablet Take 1 tablet (25 mg total) by mouth every 6 (six) hours. 10/18/11 07/24/16  Powers, Jules Husbands, MD  EPINEPHrine (EPIPEN) 0.3 mg/0.3 mL DEVI Inject 0.3 mLs (0.3 mg total) into the muscle once. Patient taking differently: Inject 0.3 mg into the muscle as needed (allergic reaction).  10/02/11   Chatten, Katherine Basset, NP  famotidine (PEPCID) 20 MG tablet Use one tablet twice daily for 3 days then as needed for recurrent swelling. Patient not taking: Reported on 07/24/2016 05/28/11   Elpidio Anis, PA-C  ipratropium (ATROVENT) 0.06 % nasal spray Place 2 sprays into the nose 4 (four) times daily. Patient not taking: Reported on 07/24/2016 01/05/13   Linna Hoff, MD  ipratropium (ATROVENT) 0.06 % nasal spray Place 2 sprays into both nostrils 4 (four) times daily. Patient not taking: Reported on 07/24/2016 05/02/13   Graylon Good, PA-C  minocycline (MINOCIN,DYNACIN) 100 MG capsule Take 1 capsule (100 mg total) by mouth 2 (two) times daily. Patient not taking: Reported on 07/24/2016 01/05/13   Linna Hoff, MD  predniSONE (DELTASONE) 10 MG tablet Take 1 tablet (10 mg total) by mouth 2 (two) times daily. 08/02/16 08/04/16  Zaydyn Havey, DO  ranitidine (ZANTAC) 150 MG tablet Take 1 tablet (150 mg total) by mouth 2 (two) times daily. Patient not taking: Reported on 07/24/2016 10/02/11 07/24/16  Johnsie Kindred, NP    Family History History reviewed. No pertinent family history.  Social History Social History  Substance Use Topics  . Smoking status: Never Smoker  . Smokeless tobacco: Never Used  . Alcohol use No     Allergies   Ibuprofen   Review of Systems Review of Systems  Constitutional: Negative for chills and fever.  HENT: Negative for ear pain and sore throat.   Eyes: Negative for pain and visual disturbance.  Respiratory: Negative for cough and shortness of breath.   Cardiovascular: Negative for chest  pain and palpitations.  Gastrointestinal: Negative for abdominal pain and vomiting.  Genitourinary: Negative for dysuria and hematuria.  Musculoskeletal: Negative for arthralgias and back pain.  Skin: Positive for itching and rash. Negative for color change.  Neurological: Negative for seizures and syncope.  All other systems reviewed and are negative.    Physical Exam Updated Vital Signs  ED Triage Vitals [08/02/16 0620]  Enc Vitals Group     BP (!) 192/117     Pulse Rate 76     Resp 16     Temp 98.8 F (37.1 C)     Temp Source Oral     SpO2 100 %      Weight 118 lb (53.5 kg)     Height 5\' 3"  (1.6 m)     Head Circumference      Peak Flow      Pain Score      Pain Loc      Pain Edu?      Excl. in GC?     Physical Exam  Constitutional: She appears well-developed and well-nourished. No distress.  HENT:  Head: Normocephalic and atraumatic.  Eyes: Conjunctivae are normal.  Neck: Neck supple.  Cardiovascular: Normal rate and regular rhythm.   No murmur heard. Pulmonary/Chest: Effort normal and breath sounds normal. No respiratory distress. She has no wheezes. She has no rales.  Abdominal: Soft. There is no tenderness.  Musculoskeletal: She exhibits no edema.  Neurological: She is alert.  Skin: Skin is warm and dry.  Psychiatric: She has a normal mood and affect.  Nursing note and vitals reviewed.    ED Treatments / Results  Labs (all labs ordered are listed, but only abnormal results are displayed) Labs Reviewed - No data to display  EKG  EKG Interpretation None       Radiology No results found.  Procedures Procedures (including critical care time)  Medications Ordered in ED Medications - No data to display   Initial Impression / Assessment and Plan / ED Course  I have reviewed the triage vital signs and the nursing notes.  Pertinent labs & imaging results that were available during my care of the patient were reviewed by me and considered in my medical decision making (see chart for details).     Alene MiresBetty Smith is a 61 year old female with history of high blood pressure who presents to the ED with rash. Patient's vitals at time of arrival to the ED are significant for hypertension but otherwise are unremarkable. Patient is without fever. Patient had rash on her chest wall and buttocks that started several hours ago. She states rash was consistent with prior hives. Patient took Benadryl prior to arrival and rashes now resolved. Patient denies shortness of breath, wheezing, nausea, vomiting, diarrhea. Patient  with allergies to ibuprofen but has never had anaphylaxis. Exam is overall unremarkable. No rash, no signs of respiratory distress, clear breath sounds bilaterally. Patient has seen allergist before in the past. She was recently here for possible allergy to some food products. Recommend follow-up with primary care provider. Patient has prescription for EpiPen already. Patient given prescription for prednisone recommend continued use of Benadryl and Pepcid at home. Told to return to the ED symptoms worsen.   Final Clinical Impressions(s) / ED Diagnoses   Final diagnoses:  Allergic reaction, initial encounter    New Prescriptions New Prescriptions   PREDNISONE (DELTASONE) 10 MG TABLET    Take 1 tablet (10 mg total) by mouth 2 (two) times daily.  Virgina Norfolk, DO 08/02/16 1005    Geoffery Lyons, MD 08/02/16 1041

## 2016-08-02 NOTE — ED Triage Notes (Signed)
Patient with hives and itchiness under and between breasts, on bottom of gluteal clef bilaterally, knees and on hands.  She also states that her tongue is swelling a bit on the right side.  No shortness of breath.  No changes in detergent or food that she has noted.

## 2016-10-12 ENCOUNTER — Other Ambulatory Visit: Payer: Self-pay

## 2016-10-12 ENCOUNTER — Ambulatory Visit (HOSPITAL_COMMUNITY)
Admission: RE | Admit: 2016-10-12 | Discharge: 2016-10-12 | Disposition: A | Discharge status: Home / Self Care | Payer: BLUE CROSS/BLUE SHIELD | Source: Ambulatory Visit | Attending: Physician Assistant | Admitting: Physician Assistant

## 2016-10-12 ENCOUNTER — Encounter (INDEPENDENT_AMBULATORY_CARE_PROVIDER_SITE_OTHER): Payer: Self-pay | Admitting: Physician Assistant

## 2016-10-12 ENCOUNTER — Ambulatory Visit (INDEPENDENT_AMBULATORY_CARE_PROVIDER_SITE_OTHER): Payer: BLUE CROSS/BLUE SHIELD | Admitting: Physician Assistant

## 2016-10-12 VITALS — BP 173/99 | HR 82 | Temp 98.9°F | Ht 62.0 in | Wt 115.8 lb

## 2016-10-12 DIAGNOSIS — M79622 Pain in left upper arm: Secondary | ICD-10-CM | POA: Diagnosis not present

## 2016-10-12 DIAGNOSIS — I10 Essential (primary) hypertension: Secondary | ICD-10-CM | POA: Diagnosis not present

## 2016-10-12 DIAGNOSIS — T783XXA Angioneurotic edema, initial encounter: Secondary | ICD-10-CM

## 2016-10-12 MED ORDER — EPINEPHRINE 0.3 MG/0.3ML IJ SOAJ: 0.3000 mg | Freq: Once | INTRAMUSCULAR | 1 refills | Status: AC

## 2016-10-12 MED ORDER — LOSARTAN POTASSIUM 50 MG PO TABS: 50.0000 mg | ORAL_TABLET | Freq: Every day | ORAL | 3 refills | Status: DC

## 2016-10-12 MED ORDER — CETIRIZINE HCL 10 MG PO TABS: 10.0000 mg | ORAL_TABLET | Freq: Every day | ORAL | 2 refills | Status: DC

## 2016-10-12 MED ORDER — HYDROCHLOROTHIAZIDE 12.5 MG PO TABS: 12.5000 mg | ORAL_TABLET | Freq: Every day | ORAL | 3 refills | Status: DC

## 2016-10-12 MED ORDER — HYDROXYZINE HCL 10 MG PO TABS: 10.0000 mg | ORAL_TABLET | Freq: Every day | ORAL | 1 refills | Status: DC

## 2016-10-12 MED ORDER — METHYLPREDNISOLONE SODIUM SUCC 125 MG IJ SOLR
125.0000 mg | Freq: Once | INTRAMUSCULAR | Status: AC
  Administered 2016-10-12: 125 mg via INTRAMUSCULAR

## 2016-10-12 MED ORDER — CLONIDINE HCL 0.1 MG PO TABS
0.1000 mg | ORAL_TABLET | Freq: Once | ORAL | Status: AC
  Administered 2016-10-12: 0.1 mg via ORAL

## 2016-10-12 NOTE — Addendum Note (Signed)
Addended by: Sindy MessingGOMEZ, ROGER D on: 10/12/2016 12:31 PM   Modules accepted: Orders

## 2016-10-12 NOTE — Patient Instructions (Signed)
Hives Hives (urticaria) are itchy, red, swollen areas on your skin. Hives can appear on any part of your body and can vary in size. They can be as small as the tip of a pen or much larger. Hives often fade within 24 hours (acute hives). In other cases, new hives appear after old ones fade. This cycle can continue for several days or weeks (chronic hives). Hives result from your body's reaction to an irritant or to something that you are allergic to (trigger). When you are exposed to a trigger, your body releases a chemical (histamine) that causes redness, itching, and swelling. You can get hives immediately after being exposed to a trigger or hours later. Hives do not spread from person to person (are not contagious). Your hives may get worse with scratching, exercise, and emotional stress. What are the causes? Causes of this condition include:  Allergies to certain foods or ingredients.  Insect bites or stings.  Exposure to pollen or pet dander.  Contact with latex or chemicals.  Spending time in sunlight, heat, or cold (exposure).  Exercise.  Stress.  You can also get hives from some medical conditions and treatments. These include:  Viruses, including the common cold.  Bacterial infections, such as urinary tract infections and strep throat.  Disorders such as vasculitis, lupus, or thyroid disease.  Certain medications.  Allergy shots.  Blood transfusions.  Sometimes, the cause of hives is not known (idiopathic hives). What increases the risk? This condition is more likely to develop in:  Women.  People who have food allergies, especially to citrus fruits, milk, eggs, peanuts, tree nuts, or shellfish.  People who are allergic to: ? Medicines. ? Latex. ? Insects. ? Animals. ? Pollen.  People who have certain medical conditions, includinglupus or thyroid disease.  What are the signs or symptoms? The main symptom of this condition is raised, itchyred or white  bumps or patches on your skin. These areas may:  Become large and swollen (welts).  Change in shape and location, quickly and repeatedly.  Be separate hives or connect over a large area of skin.  Sting or become painful.  Turn white when pressed in the center (blanch).  In severe cases, yourhands, feet, and face may also become swollen. This may occur if hives develop deeper in your skin. How is this diagnosed? This condition is diagnosed based on your symptoms, medical history, and physical exam. Your skin, urine, or blood may be tested to find out what is causing your hives and to rule out other health issues. Your health care provider may also remove a small sample of skin from the affected area and examine it under a microscope (biopsy). How is this treated? Treatment depends on the severity of your condition. Your health care provider may recommend using cool, wet cloths (cool compresses) or taking cool showers to relieve itching. Hives are sometimes treated with medicines, including:  Antihistamines.  Corticosteroids.  Antibiotics.  An injectable medicine (omalizumab). Your health care provider may prescribe this if you have chronic idiopathic hives and you continue to have symptoms even after treatment with antihistamines.  Severe cases may require an emergency injection of adrenaline (epinephrine) to prevent a life-threatening allergic reaction (anaphylaxis). Follow these instructions at home: Medicines  Take or apply over-the-counter and prescription medicines only as told by your health care provider.  If you were prescribed an antibiotic medicine, use it as told by your health care provider. Do not stop taking the antibiotic even if you start  to feel better. Skin Care  Apply cool compresses to the affected areas.  Do not scratch or rub your skin. General instructions  Do not take hot showers or baths. This can make itching worse.  Do not wear tight-fitting  clothing.  Use sunscreen and wear protective clothing when you are outside.  Avoid any substances that cause your hives. Keep a journal to help you track what causes your hives. Write down: ? What medicines you take. ? What you eat and drink. ? What products you use on your skin.  Keep all follow-up visits as told by your health care provider. This is important. Contact a health care provider if:  Your symptoms are not controlled with medicine.  Your joints are painful or swollen. Get help right away if:  You have a fever.  You have pain in your abdomen.  Your tongue or lips are swollen.  Your eyelids are swollen.  Your chest or throat feels tight.  You have trouble breathing or swallowing. These symptoms may represent a serious problem that is an emergency. Do not wait to see if the symptoms will go away. Get medical help right away. Call your local emergency services (911 in the [place] Do not drive yourself to the hospital. This information is not intended to replace advice given to you by your health care provider. Make sure you discuss any questions you have with your health care provider. Document Released: 03/09/2005 Document Revised: 08/07/2015 Document Reviewed: 12/26/2014 Elsevier Interactive Patient Education  2017 [employer] Angioedema Angioedema is the sudden swelling of tissue in the body. Angioedema can affect any part of the body, but it most often affects the deeper parts of the skin, causing red, itchy patches (hives) to appear over the affected area. It often begins during the night and is found in the morning. Depending on the cause, angioedema may happen:  Only once.  Several times. It may come back in unpredictable patterns.  Repeatedly for several years. Over time, it may gradually stop coming back.  Angioedema can be life-threatening if it affects the air passages that you breathe through. What are the causes? This condition may be caused  by:  Foods, such as milk, eggs, shellfish, wheat, or nuts.  Certain medicines, such as ACE inhibitors, antibiotics, nonsteroidal anti-inflammatory drugs, birth control pills, or dyes used in X-rays.  Insect stings.  Infections.  Angioedema can be inherited, and episodes can be triggered by:  Mild injury.  Dental work.  Surgery.  Stress.  Sudden changes in temperature.  Exercise.  In some cases, the cause of this condition is not known. What are the signs or symptoms? Symptoms of this condition depend on where the swelling happens. Symptoms may include:  Swollen skin.  Red, itchy patches of skin (hives).  Redness in the affected area.  Pain in the affected area.  Swollen lips or tongue.  Wheezing.  Breathing problems.  If your internal organs are involved, symptoms may also include:  Nausea.  Abdominal pain.  Vomiting.  Difficulty swallowing.  Difficulty passing urine.  How is this diagnosed? This condition may be diagnosed based on:  An exam of the affected area.  Your medical history.  Whether anyone in your family has had this condition before.  A review of any medicines you have been taking.  Tests, including: ? Allergy skin tests to see if the condition was caused by an allergic reaction. ? Blood tests to see if the condition was caused by a gene. ?  Tests to check for underlying diseases that could cause the condition.  How is this treated? Treatment for this condition depends on the cause. It may involve any of the following:  If something triggered the condition, making changes to keep it from triggering the condition again.  If the condition affects your breathing, having tubes placed in your airway to keep it open.  Taking medicines to treat symptoms or prevent future episodes. These may include: ? Antihistamines. ? Epinephrine injections. ? Steroids.  If your condition is severe, you may need to be treated at the hospital.  Angioedema usually gets better in 24-48 hours. Follow these instructions at home:  Take over-the-counter and prescription medicines only as told by your health care provider.  If you were given medicines for emergency allergy treatment, always carry them with you.  Wear a medical bracelet as told by your health care provider.  If something triggers your condition, avoid the trigger, if possible.  If your condition is inherited and you are thinking about having children, talk to your health care provider. It is important to discuss the risks of passing on the condition to your children. Contact a health care provider if:  You have repeated episodes of angioedema.  Episodes of angioedema start to happen more often than they used to, even after you take steps to prevent them.  You have episodes of angioedema that are more severe than they have been before, even after you take steps to prevent them.  You are thinking about having children. Get help right away if:  You have severe swelling of your mouth, tongue, or lips.  You have trouble breathing.  You have trouble swallowing.  You faint. This information is not intended to replace advice given to you by your health care provider. Make sure you discuss any questions you have with your health care provider. Document Released: 05/18/2001 Document Revised: 10/05/2015 Document Reviewed: 09/17/2015 Elsevier Interactive Patient Education  [employer]2018 [employer]

## 2016-10-12 NOTE — Progress Notes (Signed)
Subjective:  Patient ID: Rachael Smith, female    DOB: Aug 04, 1955  Age: 61 y.o. MRN: [id number]  CC: allergies  HPI Rachael Smith is a 61 y.o. female with a PMH of asthma and HTN presents with swollen upper lip since this morning and minimally relieved with Benadryl. Reports having issues with swelling, rash, and "knots" since May 2018. Patient had remote hx of allergen testing but does not remember full results. Patient has a rash when she eats chocolate, peanuts, and artifical red or blue coloring. There is currently no SOB, throat closure, nausea, vomiting, abdominal pain, rash, fever, chills, or swelling besides the upper lip.     Patient requests refill for her valsartan-HCTZ. Has been out of medication for greater than a week. Does not believe valsartan may be causing her swelling due to having taken valsartan much longer than her current symptoms of allergies.     Outpatient Medications Prior to Visit  Medication Sig Dispense Refill  . diphenhydrAMINE (BENADRYL) 25 MG tablet Take 1 tablet (25 mg total) by mouth every 6 (six) hours. 20 tablet 0  . benzonatate (TESSALON) 200 MG capsule Take 1 capsule (200 mg total) by mouth 3 (three) times daily as needed for cough. (Patient not taking: Reported on 07/24/2016) 20 capsule 0  . EPINEPHrine (EPIPEN) 0.3 mg/0.3 mL DEVI Inject 0.3 mLs (0.3 mg total) into the muscle once. (Patient not taking: Reported on 10/12/2016) 1 Device 2  . famotidine (PEPCID) 20 MG tablet Use one tablet twice daily for 3 days then as needed for recurrent swelling. (Patient not taking: Reported on 07/24/2016) 30 tablet 0  . ipratropium (ATROVENT) 0.06 % nasal spray Place 2 sprays into the nose 4 (four) times daily. (Patient not taking: Reported on 07/24/2016) 15 mL 1  . ipratropium (ATROVENT) 0.06 % nasal spray Place 2 sprays into both nostrils 4 (four) times daily. (Patient not taking: Reported on 07/24/2016) 15 mL 12  . minocycline (MINOCIN,DYNACIN) 100 MG capsule Take 1  capsule (100 mg total) by mouth 2 (two) times daily. (Patient not taking: Reported on 07/24/2016) 20 capsule 0  . ranitidine (ZANTAC) 150 MG tablet Take 1 tablet (150 mg total) by mouth 2 (two) times daily. (Patient not taking: Reported on 07/24/2016) 60 tablet 1   No facility-administered medications prior to visit.      ROS Review of Systems  Constitutional: Negative for chills, fever and malaise/fatigue.  Eyes: Negative for blurred vision.  Respiratory: Negative for shortness of breath.   Cardiovascular: Negative for chest pain and palpitations.  Gastrointestinal: Negative for abdominal pain and nausea.  Genitourinary: Negative for dysuria and hematuria.  Musculoskeletal: Negative for joint pain and myalgias.  Skin: Negative for rash.  Neurological: Negative for tingling and headaches.  Psychiatric/Behavioral: Negative for depression. The patient is not nervous/anxious.     Objective:  BP (!) 191/118 (BP Location: Left Arm, Patient Position: Sitting, Cuff Size: Normal)   Pulse 82   Temp 98.9 F (37.2 C) (Oral)   Ht 5\' 2"  (1.575 m)   Wt 115 lb 12.8 oz (52.5 kg)   SpO2 96%   BMI 21.18 kg/m   BP/Weight 10/12/2016 08/02/2016 07/24/2016  Systolic BP 191 192 167  Diastolic BP 118 117 95  Wt. (Lbs) 115.8 118 -  BMI 21.18 20.9 -      Physical Exam  Constitutional: She is oriented to person, place, and time.  Well developed, well nourished, NAD, polite  HENT:  Head: Normocephalic and atraumatic.  Eyes: No scleral icterus.  Neck: Normal range of motion. Neck supple. No thyromegaly present.  Cardiovascular: Normal rate, regular rhythm and normal heart sounds.   Pulmonary/Chest: Effort normal and breath sounds normal.  Abdominal: Soft. Bowel sounds are normal. There is no tenderness.  Musculoskeletal: She exhibits no edema.  Neurological: She is alert and oriented to person, place, and time. No cranial nerve deficit. Coordination normal.  Skin: Skin is warm and dry. No rash noted.  No erythema. No pallor.  Left aspect of upper lip is swollen to approximately double the normal size.   Psychiatric: She has a normal mood and affect. Her behavior is normal. Thought content normal.  Vitals reviewed.    Assessment & Plan:   1. Angioedema, initial encounter - Administered methylPREDNISolone sodium succinate (SOLU-MEDROL) 125 mg/2 mL injection 125 mg; Inject 2 mLs (125 mg total) into the muscle once. - Begin EPINEPHrine (EPIPEN 2-PAK) 0.3 mg/0.3 mL IJ SOAJ injection; Inject 0.3 mLs (0.3 mg total) into the muscle once.  Dispense: 1 Device; Refill: 1 - Begin cetirizine (ZYRTEC) 10 MG tablet; Take 1 tablet (10 mg total) by mouth daily.  Dispense: 30 tablet; Refill: 2 - Begin hydrOXYzine (ATARAX/VISTARIL) 10 MG tablet; Take 1 tablet (10 mg total) by mouth at bedtime.  Dispense: 30 tablet; Refill: 1 - Allergens, Zone 2 - Allergen Profile, Basic Food - CBC with Differential - Comprehensive metabolic panel - TSH - C3 complement - Ambulatory referral to Allergy   2. Pain of left upper arm - EKG 12-Lead could not be done in clinic due to software conflict between [employer] and [employer]. Sent patient to [facility] for EKG. - CBC with Differential - Comprehensive metabolic panel - TSH  3. Hypertension, unspecified type - Begin Losartan 50 mg - Begin HCTZ 12.5 mg   Meds ordered this encounter  Medications  . methylPREDNISolone sodium succinate (SOLU-MEDROL) 125 mg/2 mL injection 125 mg  . EPINEPHrine (EPIPEN 2-PAK) 0.3 mg/0.3 mL IJ SOAJ injection    Sig: Inject 0.3 mLs (0.3 mg total) into the muscle once.    Dispense:  1 Device    Refill:  1    Order Specific Question:   Supervising Provider    Answer:   Quentin Angst [id number]  . cetirizine (ZYRTEC) 10 MG tablet    Sig: Take 1 tablet (10 mg total) by mouth daily.    Dispense:  30 tablet    Refill:  2    Order Specific Question:   Supervising Provider    Answer:   Quentin Angst [id number]  . hydrOXYzine  (ATARAX/VISTARIL) 10 MG tablet    Sig: Take 1 tablet (10 mg total) by mouth at bedtime.    Dispense:  30 tablet    Refill:  1    Order Specific Question:   Supervising Provider    Answer:   Quentin Angst [id number]  . hydrochlorothiazide (HYDRODIURIL) 12.5 MG tablet    Sig: Take 1 tablet (12.5 mg total) by mouth daily.    Dispense:  90 tablet    Refill:  3    Order Specific Question:   Supervising Provider    Answer:   Quentin Angst [id number]  . losartan (COZAAR) 50 MG tablet    Sig: Take 1 tablet (50 mg total) by mouth daily.    Dispense:  90 tablet    Refill:  3    Order Specific Question:   Supervising Provider    Answer:   Quentin Angst [id number]    Follow-up: 4 weeks  for HTN  Loletta Specteroger David Gomez PA

## 2016-10-15 LAB — COMPREHENSIVE METABOLIC PANEL
A/G RATIO: 1.5 (ref 1.2–2.2)
ALT: 12 IU/L (ref 0–32)
AST: 19 IU/L (ref 0–40)
Albumin: 4.9 g/dL — ABNORMAL HIGH (ref 3.6–4.8)
Alkaline Phosphatase: 103 IU/L (ref 39–117)
BUN/Creatinine Ratio: 13 (ref 12–28)
BUN: 10 mg/dL (ref 8–27)
Bilirubin Total: 0.4 mg/dL (ref 0.0–1.2)
CALCIUM: 9.9 mg/dL (ref 8.7–10.3)
CHLORIDE: 102 mmol/L (ref 96–106)
CO2: 26 mmol/L (ref 20–29)
Creatinine, Ser: 0.8 mg/dL (ref 0.57–1.00)
GFR calc Af Amer: 92 mL/min/{1.73_m2} (ref >59)
GFR, EST NON AFRICAN AMERICAN: 80 mL/min/{1.73_m2} (ref >59)
Globulin, Total: 3.3 g/dL (ref 1.5–4.5)
Glucose: 91 mg/dL (ref 65–99)
POTASSIUM: 4 mmol/L (ref 3.5–5.2)
Sodium: 142 mmol/L (ref 134–144)
Total Protein: 8.2 g/dL (ref 6.0–8.5)

## 2016-10-15 LAB — CBC WITH DIFFERENTIAL/PLATELET
BASOS: 1 %
Basophils Absolute: 0 10*3/uL (ref 0.0–0.2)
EOS (ABSOLUTE): 0.5 10*3/uL — ABNORMAL HIGH (ref 0.0–0.4)
Eos: 8 %
Hematocrit: 40.9 % (ref 34.0–46.6)
Hemoglobin: 13.1 g/dL (ref 11.1–15.9)
IMMATURE GRANULOCYTES: 0 %
Immature Grans (Abs): 0 10*3/uL (ref 0.0–0.1)
LYMPHS: 43 %
Lymphocytes Absolute: 2.4 10*3/uL (ref 0.7–3.1)
MCH: 27.6 pg (ref 26.6–33.0)
MCHC: 32 g/dL (ref 31.5–35.7)
MCV: 86 fL (ref 79–97)
MONOS ABS: 0.4 10*3/uL (ref 0.1–0.9)
Monocytes: 8 %
NEUTROS PCT: 40 %
Neutrophils Absolute: 2.1 10*3/uL (ref 1.4–7.0)
PLATELETS: 331 10*3/uL (ref 150–379)
RBC: 4.75 x10E6/uL (ref 3.77–5.28)
RDW: 13.8 % (ref 12.3–15.4)
WBC: 5.4 10*3/uL (ref 3.4–10.8)

## 2016-10-15 LAB — ALLERGENS, ZONE 2
Amer Sycamore IgE Qn: <0.1 kU/L
Aspergillus Fumigatus IgE: 0.1 kU/L — AB
Cat Dander IgE: <0.1 kU/L
Cladosporium Herbarum IgE: <0.1 kU/L
D Farinae IgE: 7.36 kU/L — AB
D Pteronyssinus IgE: 7.54 kU/L — AB
Dog Dander IgE: <0.1 kU/L
Johnson Grass IgE: <0.1 kU/L
M001-IGE PENICILLIUM CHRYSOGEN: 0.12 kU/L — AB
Maple/Box Elder IgE: <0.1 kU/L
Nettle IgE: <0.1 kU/L
Ragweed, Short IgE: <0.1 kU/L
Sheep Sorrel IgE Qn: <0.1 kU/L
Stemphylium Herbarum IgE: <0.1 kU/L
T006-IGE CEDAR, MOUNTAIN: 0.11 kU/L — AB
White Mulberry IgE: <0.1 kU/L

## 2016-10-15 LAB — TSH: TSH: 2.57 u[IU]/mL (ref 0.450–4.500)

## 2016-10-15 LAB — ALLERGEN PROFILE, BASIC FOOD
Allergen Corn, IgE: <0.1 kU/L
FOOD MIX (SEAFOODS) IGE: NEGATIVE
Peanut IgE: <0.1 kU/L
Pork IgE: <0.1 kU/L
Soybean IgE: <0.1 kU/L
Wheat IgE: <0.1 kU/L

## 2016-10-15 LAB — C3 COMPLEMENT: COMPLEMENT C3, SERUM: 132 mg/dL (ref 82–167)

## 2016-11-02 ENCOUNTER — Encounter (INDEPENDENT_AMBULATORY_CARE_PROVIDER_SITE_OTHER): Payer: Self-pay | Admitting: Physician Assistant

## 2016-11-02 ENCOUNTER — Ambulatory Visit (INDEPENDENT_AMBULATORY_CARE_PROVIDER_SITE_OTHER): Payer: BLUE CROSS/BLUE SHIELD | Admitting: Physician Assistant

## 2016-11-02 VITALS — BP 158/97 | HR 79 | Temp 98.1°F | Wt 116.4 lb

## 2016-11-02 DIAGNOSIS — I10 Essential (primary) hypertension: Secondary | ICD-10-CM

## 2016-11-02 DIAGNOSIS — T783XXA Angioneurotic edema, initial encounter: Secondary | ICD-10-CM | POA: Diagnosis not present

## 2016-11-02 MED ORDER — DIPHENHYDRAMINE HCL 25 MG PO CAPS
50.0000 mg | ORAL_CAPSULE | Freq: Once | ORAL | Status: AC
  Administered 2016-11-02: 50 mg via ORAL

## 2016-11-02 MED ORDER — AMLODIPINE BESYLATE 10 MG PO TABS: 10.0000 mg | ORAL_TABLET | Freq: Every day | ORAL | 1 refills | Status: DC

## 2016-11-02 MED ORDER — METHYLPREDNISOLONE SODIUM SUCC 125 MG IJ SOLR
125.0000 mg | Freq: Once | INTRAMUSCULAR | Status: AC
  Administered 2016-11-02: 125 mg via INTRAMUSCULAR

## 2016-11-02 NOTE — Progress Notes (Signed)
Subjective:  Patient ID: Rachael Smith, female    DOB: July 21, 1955  Age: 61 y.o. MRN: [id number]  CC: allergic reaction.  HPI Rachael Smith is a 61 y.o. female with a PMH of asthma, angioedema, and HTN presents with angioedema of the lips. Took a cetirizine tablet this morning and has noticed a reduction in lip edema. Recent laboratory testing only remarkable for dust mite allergies. Negative basic food allergen, normal C3, normal CBC, normal CMP, normal TSH. Patient does not endorse abdominal pain, headache, SOB, rash, confusion, choking sensation, or swelling elsewhere. Does not endorse any other complaint or symptom.    Outpatient Medications Prior to Visit  Medication Sig Dispense Refill  . cetirizine (ZYRTEC) 10 MG tablet Take 1 tablet (10 mg total) by mouth daily. 30 tablet 2  . diphenhydrAMINE (BENADRYL) 25 MG tablet Take 1 tablet (25 mg total) by mouth every 6 (six) hours. 20 tablet 0  . hydrochlorothiazide (HYDRODIURIL) 12.5 MG tablet Take 1 tablet (12.5 mg total) by mouth daily. 90 tablet 3  . hydrOXYzine (ATARAX/VISTARIL) 10 MG tablet Take 1 tablet (10 mg total) by mouth at bedtime. 30 tablet 1  . losartan (COZAAR) 50 MG tablet Take 1 tablet (50 mg total) by mouth daily. 90 tablet 3   No facility-administered medications prior to visit.      ROS Review of Systems  Constitutional: Negative for chills, fever and malaise/fatigue.  Eyes: Negative for blurred vision.  Respiratory: Negative for shortness of breath.   Cardiovascular: Negative for chest pain and palpitations.  Gastrointestinal: Negative for abdominal pain and nausea.  Genitourinary: Negative for dysuria and hematuria.  Musculoskeletal: Negative for joint pain and myalgias.  Skin: Negative for rash.       Angioedema of the lips  Neurological: Negative for tingling and headaches.  Psychiatric/Behavioral: Negative for depression. The patient is not nervous/anxious.     Objective:  BP (!) 158/97 (BP  Location: Left Arm, Patient Position: Sitting, Cuff Size: Normal)   Pulse 79   Temp 98.1 F (36.7 C) (Oral)   Wt 116 lb 6.4 oz (52.8 kg)   SpO2 98%   BMI 21.29 kg/m   BP/Weight 11/02/2016 10/12/2016 08/02/2016  Systolic BP 158 173 192  Diastolic BP 97 99 117  Wt. (Lbs) 116.4 115.8 118  BMI 21.29 21.18 20.9      Physical Exam  Constitutional: She is oriented to person, place, and time.  Well developed, well nourished, NAD, polite  HENT:  Head: Normocephalic and atraumatic.  Tonsils 1+. No oropharyngeal edema to include the tongue.  Eyes: No scleral icterus.  Cardiovascular: Normal rate, regular rhythm and normal heart sounds.   Pulmonary/Chest: Effort normal and breath sounds normal.  Abdominal: Soft. Bowel sounds are normal. There is no tenderness.  Neurological: She is alert and oriented to person, place, and time. No cranial nerve deficit. Coordination normal.  Skin: Skin is warm and dry. No rash noted. No erythema. No pallor.  Lips are mildly edematous  Psychiatric: She has a normal mood and affect. Her behavior is normal. Thought content normal.  Vitals reviewed.    Assessment & Plan:   1. Angioedema, initial encounter - methylPREDNISolone sodium succinate (SOLU-MEDROL) 125 mg/2 mL injection 125 mg; Inject 2 mLs (125 mg total) into the muscle once. - Administerd Benadryl 50 mg in clinic. - Ambulatory referral to Allergy - Pt observed over one hour. She responded well to Solu-medrol and Benadryl.  2. Hypertension, unspecified type - Stop Losartan - Begin amLODipine (NORVASC) 10  MG tablet; Take 1 tablet (10 mg total) by mouth daily.  Dispense: 90 tablet; Refill: 1   Meds ordered this encounter  Medications  . methylPREDNISolone sodium succinate (SOLU-MEDROL) 125 mg/2 mL injection 125 mg  . amLODipine (NORVASC) 10 MG tablet    Sig: Take 1 tablet (10 mg total) by mouth daily.    Dispense:  90 tablet    Refill:  1    Order Specific Question:   Supervising Provider     Answer:   Quentin AngstJEGEDE, OLUGBEMIGA E [id number]    Follow-up: Return in about 4 weeks (around 11/30/2016) for HTN.   Loletta Specteroger David Sunshyne Horvath PA

## 2016-11-02 NOTE — Patient Instructions (Addendum)
Please be aware that urgent care or emergency rooms are structured to see you at a moments notice if indicated. They are also better equipped should you need further care based on the severity of your allergic reaction.   Angioedema Angioedema is the sudden swelling of tissue in the body. Angioedema can affect any part of the body, but it most often affects the deeper parts of the skin, causing red, itchy patches (hives) to appear over the affected area. It often begins during the night and is found in the morning. Depending on the cause, angioedema may happen:  Only once.  Several times. It may come back in unpredictable patterns.  Repeatedly for several years. Over time, it may gradually stop coming back.  Angioedema can be life-threatening if it affects the air passages that you breathe through. What are the causes? This condition may be caused by:  Foods, such as milk, eggs, shellfish, wheat, or nuts.  Certain medicines, such as ACE inhibitors, antibiotics, nonsteroidal anti-inflammatory drugs, birth control pills, or dyes used in X-rays.  Insect stings.  Infections.  Angioedema can be inherited, and episodes can be triggered by:  Mild injury.  Dental work.  Surgery.  Stress.  Sudden changes in temperature.  Exercise.  In some cases, the cause of this condition is not known. What are the signs or symptoms? Symptoms of this condition depend on where the swelling happens. Symptoms may include:  Swollen skin.  Red, itchy patches of skin (hives).  Redness in the affected area.  Pain in the affected area.  Swollen lips or tongue.  Wheezing.  Breathing problems.  If your internal organs are involved, symptoms may also include:  Nausea.  Abdominal pain.  Vomiting.  Difficulty swallowing.  Difficulty passing urine.  How is this diagnosed? This condition may be diagnosed based on:  An exam of the affected area.  Your medical history.  Whether  anyone in your family has had this condition before.  A review of any medicines you have been taking.  Tests, including: ? Allergy skin tests to see if the condition was caused by an allergic reaction. ? Blood tests to see if the condition was caused by a gene. ? Tests to check for underlying diseases that could cause the condition.  How is this treated? Treatment for this condition depends on the cause. It may involve any of the following:  If something triggered the condition, making changes to keep it from triggering the condition again.  If the condition affects your breathing, having tubes placed in your airway to keep it open.  Taking medicines to treat symptoms or prevent future episodes. These may include: ? Antihistamines. ? Epinephrine injections. ? Steroids.  If your condition is severe, you may need to be treated at the hospital. Angioedema usually gets better in 24-48 hours. Follow these instructions at home:  Take over-the-counter and prescription medicines only as told by your health care provider.  If you were given medicines for emergency allergy treatment, always carry them with you.  Wear a medical bracelet as told by your health care provider.  If something triggers your condition, avoid the trigger, if possible.  If your condition is inherited and you are thinking about having children, talk to your health care provider. It is important to discuss the risks of passing on the condition to your children. Contact a health care provider if:  You have repeated episodes of angioedema.  Episodes of angioedema start to happen more often than they used  to, even after you take steps to prevent them.  You have episodes of angioedema that are more severe than they have been before, even after you take steps to prevent them.  You are thinking about having children. Get help right away if:  You have severe swelling of your mouth, tongue, or lips.  You have trouble  breathing.  You have trouble swallowing.  You faint. This information is not intended to replace advice given to you by your health care provider. Make sure you discuss any questions you have with your health care provider. Document Released: 05/18/2001 Document Revised: 10/05/2015 Document Reviewed: 09/17/2015 Elsevier Interactive Patient Education  [employer].

## 2016-11-09 ENCOUNTER — Ambulatory Visit (INDEPENDENT_AMBULATORY_CARE_PROVIDER_SITE_OTHER): Payer: BLUE CROSS/BLUE SHIELD | Admitting: Physician Assistant

## 2016-11-13 ENCOUNTER — Ambulatory Visit (INDEPENDENT_AMBULATORY_CARE_PROVIDER_SITE_OTHER): Payer: BLUE CROSS/BLUE SHIELD | Admitting: Allergy

## 2016-11-13 ENCOUNTER — Encounter: Payer: Self-pay | Admitting: Allergy

## 2016-11-13 VITALS — BP 134/92 | HR 75 | Temp 98.4°F | Resp 17 | Ht 62.0 in | Wt 119.2 lb

## 2016-11-13 DIAGNOSIS — T783XXD Angioneurotic edema, subsequent encounter: Secondary | ICD-10-CM

## 2016-11-13 DIAGNOSIS — L508 Other urticaria: Secondary | ICD-10-CM

## 2016-11-13 DIAGNOSIS — J3089 Other allergic rhinitis: Secondary | ICD-10-CM | POA: Diagnosis not present

## 2016-11-13 MED ORDER — RANITIDINE HCL 150 MG PO TABS: 150.0000 mg | ORAL_TABLET | Freq: Two times a day (BID) | ORAL | 5 refills | Status: DC

## 2016-11-13 MED ORDER — MONTELUKAST SODIUM 10 MG PO TABS: 10.0000 mg | ORAL_TABLET | Freq: Every day | ORAL | 5 refills | Status: DC

## 2016-11-13 NOTE — Patient Instructions (Addendum)
Chronic hives and swelling - description and appearance of your hives and swelling are classic and have no concerning features at this time.  Please let us know if hives and swelling start to leave any marks/bruising once gone, you have any joint aches/pain with hives or fevers let us know.   - your labs done by your PCP are reassuring however will obtain additional labs including: tryptase, chronic hive panel and alpha-gal panel - continue Cetirizine 10mg  twice a day - start Zantac 150mg  twice a day - start Singulair 10mg  at bedtime - use hydroxyzine and other allergy relief medication as needed for breakthrough symptoms  - discussed Xolair monthly injectable medication for hive and swelling management if above regimen is not effective enough.  Benefits/risks discussed and provided with info brochure.   Allergic rhinitis - labs done by PCP does show allergy to dust mite, molds, trees - once hives/swelling has resolved continue Cetirizine 10mg  daily  Follow-up 4-8 weeks.

## 2016-11-13 NOTE — Progress Notes (Addendum)
New Patient Note  RE: Rachael Smith MRN: [id number] DOB: 1955/08/26 Date of Office Visit: 11/13/2016  Referring provider: Loletta Specter, PA-C Primary care provider: Loletta Specter, PA-C  Chief Complaint: hives  History of present illness: Rachael Smith is a 61 y.o. female presenting today for consultation for hives.   She has been having hives and lip, tongue, periorbital and finger swelling.  She started have hives and swelling when she was in her 88s and would have an outbreak about yearly.  She reports she has been having more frequent symptoms this year and reports daily symptoms for past 3 months.   She reports her lip and tongue swelling occurred twice in May.  She went to ED both times with swelling and recalls getting IV medications.   She has also seen her PCP who gave her a steroid shot and reports she has 2 shots since May.   She reports hives will go away in about 1-2 hours without any medications and quicker if she takes antihistamine.   No fevers, joint aches/pain, and hives do not leave any marks/bruising once resolve.  She denies any preceding illness, no new foods, medications, stings, change in lotions/soaps/detergents.  She states the hives are more active during warmer weather.   She is taking cetirizine twice daily and hydroxyzine at bedtime and she will also take an OTC allergy relief as needed when she "feels the swelling coming". She reports she was started on Losartan for BP control in July and she had lip swelling and thus she was taken off and changed to different agent.  She states she is concerned about corn as she reports she has developed hives after eating popcorn and other corn dishes but it doesn't happen with each ingestion.    She did have labs done by PCP (See below).    She has no history of asthma, eczema but does report nasal congestion/drainage and sneezing during pollen seasons and will use zyrtec for control.    Review of  systems: Review of Systems  Constitutional: Negative for chills, fever and malaise/fatigue.  HENT: Negative for congestion, ear discharge, ear pain, nosebleeds, sinus pain, sore throat and tinnitus.   Eyes: Negative for discharge and redness.  Respiratory: Negative for cough, shortness of breath and wheezing.   Cardiovascular: Negative for chest pain.  Gastrointestinal: Negative for abdominal pain, constipation, diarrhea, heartburn, nausea and vomiting.  Musculoskeletal: Negative for joint pain.  Skin: Positive for itching and rash.  Neurological: Negative for headaches.    All other systems negative unless noted above in HPI  Past medical history: Past Medical History:  Diagnosis Date  . Hypertension     Past surgical history: Past Surgical History:  Procedure Laterality Date  . ABDOMINAL HYSTERECTOMY      Family history:  History reviewed. No pertinent family history.  Social history: She lives in an apartment with carpeting in home with electric heating and central cooling.  There are no pets in the home.  There is no concern for water damage, mildew or roaches in the home.  She works in Personnel officer.  She has no smoking history.    Medication List: Allergies as of 11/13/2016      Reactions   Ibuprofen Swelling   Losartan Swelling      Medication List       Accurate as of 11/13/16  9:16 PM. Always use your most recent med list.          amLODipine  10 MG tablet Commonly known as:  NORVASC Take 1 tablet (10 mg total) by mouth daily.   cetirizine 10 MG tablet Commonly known as:  ZYRTEC Take 1 tablet (10 mg total) by mouth daily.   diphenhydrAMINE 25 MG tablet Commonly known as:  BENADRYL Take 1 tablet (25 mg total) by mouth every 6 (six) hours.   hydrochlorothiazide 12.5 MG tablet Commonly known as:  HYDRODIURIL Take 1 tablet (12.5 mg total) by mouth daily.   hydrOXYzine 10 MG tablet Commonly known as:  ATARAX/VISTARIL Take 1 tablet (10 mg total) by  mouth at bedtime.   montelukast 10 MG tablet Commonly known as:  SINGULAIR Take 1 tablet (10 mg total) by mouth at bedtime.   ranitidine 150 MG tablet Commonly known as:  ZANTAC Take 1 tablet (150 mg total) by mouth 2 (two) times daily.            Discharge Care Instructions        Start     Ordered   11/13/16 0000  Tryptase     11/13/16 1033   11/13/16 0000  Chronic Urticaria     11/13/16 1033   11/13/16 0000  montelukast (SINGULAIR) 10 MG tablet  Daily at bedtime     11/13/16 1033   11/13/16 0000  ranitidine (ZANTAC) 150 MG tablet  2 times daily     11/13/16 1033   11/13/16 0000  Alpha-Gal Panel     11/13/16 1033      Known medication allergies: Allergies  Allergen Reactions  . Ibuprofen Swelling  . Losartan Swelling     Physical examination: Blood pressure (!) 134/92, pulse 75, temperature 98.4 F (36.9 C), resp. rate 17, height 5\' 2"  (1.575 m), weight 119 lb 3.2 oz (54.1 kg), SpO2 96 %.  General: Alert, interactive, in no acute distress. HEENT: PERRLA, TMs pearly gray, turbinates minimally edematous without discharge, post-pharynx non erythematous. Neck: Supple without lymphadenopathy. Lungs: Clear to auscultation without wheezing, rhonchi or rales. {no increased work of breathing. CV: Normal S1, S2 without murmurs. Abdomen: Nondistended, nontender. Skin: Scattered erythematous urticarial type lesions primarily located right hand and arm , nonvesicular. Extremities:  No clubbing, cyanosis or edema. Neuro:   Grossly intact.  Diagnositics/Labs: Labs: Component     Latest Ref Rng & Units 10/12/2016  D Pteronyssinus IgE     Class IV kU/L 7.54 (A)  D Farinae IgE     Class IV kU/L 7.36 (A)  Cat Dander IgE     Class 0 kU/L <0.10  Dog Dander IgE     Class 0 kU/L <0.10  [place] Grass IgE     Class 0 kU/L <0.10  Timothy Grass IgE     Class 0 kU/L <0.10  [place] Grass IgE     Class 0 kU/L <0.10  B[place]ia Grass IgE     Class 0 kU/L <0.10  Cockroach,  American IgE     Class 0 kU/L <0.10  Penicillium Chrysogen IgE     Class 0/I kU/L 0.12 (A)  Cladosporium Herbarum IgE     Class 0 kU/L <0.10  Aspergillus Fumigatus IgE     Class 0/I kU/L 0.10 (A)  Mucor Racemosus IgE     Class 0 kU/L <0.10  Alternaria Alternata IgE     Class 0 kU/L <0.10  Stemphylium Herbarum IgE     Class 0 kU/L <0.10  Common Silver Charletta Cousin IgE     Class 0 kU/L <0.10  Oak, White IgE  Class 0 kU/L <0.10  Elm, American IgE     Class 0 kU/L <0.10  Maple/Box Elder IgE     Class 0 kU/L <0.10  Hickory, White IgE     Class 0 kU/L <0.10  Amer Sycamore IgE Qn     Class 0 kU/L <0.10  White Mulberry IgE     Class 0 kU/L <0.10  Sweet gum IgE RAST Ql     Class 0 kU/L <0.10  Cedar, [place] IgE     Class 0/I kU/L 0.11 (A)  Ragweed, Short IgE     Class 0 kU/L <0.10  Mugwort IgE Qn     Class 0 kU/L <0.10  Plantain, English IgE     Class 0 kU/L <0.10  Pigweed, Rough IgE     Class 0 kU/L <0.10  Sheep Sorrel IgE Qn     Class 0 kU/L <0.10  Nettle IgE     Class 0 kU/L <0.10  WBC     3.4 - 10.8 x10E3/uL 5.4  RBC     3.77 - 5.28 x10E6/uL 4.75  Hemoglobin     11.1 - 15.9 g/dL 45.0  HCT     38.8 - 82.8 % 40.9  MCV     79 - 97 fL 86  MCH     26.6 - 33.0 pg 27.6  MCHC     31.5 - 35.7 g/dL 00.3  RDW     49.1 - 79.1 % 13.8  Platelets     150 - 379 x10E3/uL 331  Neutrophils     Not Estab. % 40  Lymphs     Not Estab. % 43  Monocytes     Not Estab. % 8  Eos     Not Estab. % 8  Basos     Not Estab. % 1  NEUT#     1.4 - 7.0 x10E3/uL 2.1  Lymphocyte #     0.7 - 3.1 x10E3/uL 2.4  Monocytes Absolute     0.1 - 0.9 x10E3/uL 0.4  EOS (ABSOLUTE)     0.0 - 0.4 x10E3/uL 0.5 (H)  Basophils Absolute     0.0 - 0.2 x10E3/uL 0.0  Immature Granulocytes     Not Estab. % 0  Immature Grans (Abs)     0.0 - 0.1 x10E3/uL 0.0  Glucose     65 - 99 mg/dL 91  BUN     8 - 27 mg/dL 10  Creatinine     5.05 - 1.00 mg/dL 6.97  GFR, Est Non African American     >59  mL/min/1.73 80  GFR, Est African American     >59 mL/min/1.73 92  BUN/Creatinine Ratio     12 - 28 13  Sodium     134 - 144 mmol/L 142  Potassium     3.5 - 5.2 mmol/L 4.0  Chloride     96 - 106 mmol/L 102  CO2     20 - 29 mmol/L 26  Calcium     8.7 - 10.3 mg/dL 9.9  Total Protein     6.0 - 8.5 g/dL 8.2  Albumin     3.6 - 4.8 g/dL 4.9 (H)  Globulin, Total     1.5 - 4.5 g/dL 3.3  Albumin/Globulin Ratio     1.2 - 2.2 1.5  Total Bilirubin     0.0 - 1.2 mg/dL 0.4  Alkaline Phosphatase     39 - 117 IU/L 103  AST     0 -  40 IU/L 19  ALT     0 - 32 IU/L 12  Milk IgE     Class 0 kU/L <0.10  Wheat IgE     Class 0 kU/L <0.10  Allergen Corn, IgE     Class 0 kU/L <0.10  Peanut IgE     Class 0 kU/L <0.10  Soybean IgE     Class 0 kU/L <0.10  Pork IgE     Class 0 kU/L <0.10  Beef IgE     Class 0 kU/L <0.10  Food Mix (Seafoods) IgE      Negative  Egg, Whole IgE     Class 0 kU/L <0.10  Chocolate/Cacao IgE     Class 0 kU/L <0.10  TSH     0.450 - 4.500 uIU/mL 2.570  Complement C3, Serum     82 - 167 mg/dL 161    Allergy testing: deferred due to ongoing urticaria Allergy testing results were read and interpreted by provider, documented by clinical staff.   Assessment and plan:   Chronic  Urticaria with angioedema - description and appearance of your hives and swelling are classic and have no concerning features at this time.  Please let us know if hives and swelling start to leave any marks/bruising once gone, you have any joint aches/pain with hives or fevers let us know.  She is most concern about the swelling she has been having.   - labs done by your PCP are reassuring and negative serum IgE to corn however will obtain additional labs including: tryptase, chronic hive panel and alpha-gal panel - continue Cetirizine 10mg  twice a day - start Zantac 150mg  twice a day - start Singulair 10mg  at bedtime - use hydroxyzine and other allergy relief medication as needed for  breakthrough symptoms  - discussed Xolair monthly injectable medication for management if above regimen is not effective enough.  Benefits/risks discussed and provided with info brochure.   - advised she continue discontinuation of ARB and also would avoid ACEI due to concern with ongoing oral angioedema   Allergic rhinitis - labs done by PCP does show allergy to dust mite, molds, tree pollen.  - once hives/swelling has resolved continue Cetirizine 10mg  daily  Follow-up 4-8 weeks.    I appreciate the opportunity to take part in Rachael Smith's care. Please do not hesitate to contact me with questions.  Sincerely,   Margo Aye, MD Allergy/Immunology [facility]

## 2016-11-20 LAB — CHRONIC URTICARIA: cu index: 3.1 (ref <10)

## 2016-11-20 LAB — ALPHA-GAL PANEL
Alpha Gal IgE*: <0.1 kU/L (ref <0.35)
BEEF CLASS INTERPRETATION: 0
Class Interpretation: 0
Lamb/Mutton (Ovis spp) IgE: <0.1 kU/L (ref <0.35)
PORK CLASS INTERPRETATION: 0

## 2016-11-20 LAB — TRYPTASE: TRYPTASE: 1.8 ug/L — AB (ref 2.2–13.2)

## 2016-11-30 ENCOUNTER — Ambulatory Visit (INDEPENDENT_AMBULATORY_CARE_PROVIDER_SITE_OTHER): Payer: BLUE CROSS/BLUE SHIELD | Admitting: Physician Assistant

## 2016-11-30 ENCOUNTER — Encounter (INDEPENDENT_AMBULATORY_CARE_PROVIDER_SITE_OTHER): Payer: Self-pay | Admitting: Physician Assistant

## 2016-11-30 VITALS — BP 110/71 | HR 71 | Temp 97.9°F | Resp 18 | Ht 62.0 in | Wt 121.0 lb

## 2016-11-30 DIAGNOSIS — I10 Essential (primary) hypertension: Secondary | ICD-10-CM | POA: Diagnosis not present

## 2016-11-30 DIAGNOSIS — Z23 Encounter for immunization: Secondary | ICD-10-CM | POA: Diagnosis not present

## 2016-11-30 MED ORDER — AMLODIPINE BESYLATE 10 MG PO TABS: 10.0000 mg | ORAL_TABLET | Freq: Every day | ORAL | 3 refills | Status: DC

## 2016-11-30 MED ORDER — HYDROCHLOROTHIAZIDE 12.5 MG PO TABS: 12.5000 mg | ORAL_TABLET | Freq: Every day | ORAL | 3 refills | Status: DC

## 2016-11-30 NOTE — Progress Notes (Signed)
Subjective:  Patient ID: Rachael Smith, female    DOB: 05-Nov-1955  Age: 61 y.o. MRN: [id number]  CC: No chief complaint on file.   HPI Rachael Smith is a 61 y.o. female with a medical history of  HTN presents to f/u on HTN. BP 158/97 on 11/02/16. BP 110/71 today. Has been taking medications as directed. Brings a recall letter from [facility] stating a bottle of HCTZ 12.5 mg was found mixed with Spironolactone 25 mg. Patient has felt well and continued taking her antihypertensives as directed. Does not endorse any symptoms or complaints to include CP, palpitations, SOB, tingling, numbness, or fatigue.        Outpatient Medications Prior to Visit  Medication Sig Dispense Refill  . amLODipine (NORVASC) 10 MG tablet Take 1 tablet (10 mg total) by mouth daily. 90 tablet 1  . cetirizine (ZYRTEC) 10 MG tablet Take 1 tablet (10 mg total) by mouth daily. 30 tablet 2  . hydrOXYzine (ATARAX/VISTARIL) 10 MG tablet Take 1 tablet (10 mg total) by mouth at bedtime. 30 tablet 1  . montelukast (SINGULAIR) 10 MG tablet Take 1 tablet (10 mg total) by mouth at bedtime. 30 tablet 5  . ranitidine (ZANTAC) 150 MG tablet Take 1 tablet (150 mg total) by mouth 2 (two) times daily. 60 tablet 5  . diphenhydrAMINE (BENADRYL) 25 MG tablet Take 1 tablet (25 mg total) by mouth every 6 (six) hours. 20 tablet 0  . hydrochlorothiazide (HYDRODIURIL) 12.5 MG tablet Take 1 tablet (12.5 mg total) by mouth daily. (Patient not taking: Reported on 11/30/2016) 90 tablet 3   No facility-administered medications prior to visit.      ROS Review of Systems  Constitutional: Negative for chills, fever and malaise/fatigue.  Eyes: Negative for blurred vision.  Respiratory: Negative for shortness of breath.   Cardiovascular: Negative for chest pain and palpitations.  Gastrointestinal: Negative for abdominal pain and nausea.  Genitourinary: Negative for dysuria and hematuria.  Musculoskeletal: Negative for joint pain and myalgias.   Skin: Negative for rash.  Neurological: Negative for tingling and headaches.  Psychiatric/Behavioral: Negative for depression. The patient is not nervous/anxious.     Objective:  BP 110/71 (BP Location: Left Arm, Patient Position: Sitting, Cuff Size: Normal)   Pulse 71   Temp 97.9 F (36.6 C) (Oral)   Resp 18   Ht  (1.575 m)   Wt 121 lb (54.9 kg)   SpO2 100%   BMI 22.13 kg/m   BP/Weight 11/30/2016 11/13/2016 11/02/2016  Systolic BP 110 134 158  Diastolic BP 71 92 97  Wt. (Lbs) 121 119.2 116.4  BMI 22.13 21.8 21.29      Physical Exam  Constitutional: She is oriented to person, place, and time.  Well developed, well nourished, NAD, polite  HENT:  Head: Normocephalic and atraumatic.  Eyes: No scleral icterus.  Neck: Normal range of motion. Neck supple. No thyromegaly present.  Cardiovascular: Normal rate, regular rhythm and normal heart sounds.   Pulmonary/Chest: Effort normal and breath sounds normal.  Musculoskeletal: She exhibits no edema.  Neurological: She is alert and oriented to person, place, and time.  Skin: Skin is warm and dry. No rash noted. No erythema. No pallor.  Psychiatric: She has a normal mood and affect. Her behavior is normal. Thought content normal.  Vitals reviewed.    Assessment & Plan:   1. Hypertension, unspecified type - Refill amLODipine (NORVASC) 10 MG tablet; Take 1 tablet (10 mg total) by mouth daily.  Dispense: 90 tablet;  Refill: 3 - Refill hydrochlorothiazide (HYDRODIURIL) 12.5 MG tablet; Take 1 tablet (12.5 mg total) by mouth daily.  Dispense: 90 tablet; Refill: 3 - Basic Metabolic Panel to r/o hyperkalemia secondary to possible spironolactone dispensing error from [facility].   2. Need for Tdap vaccination - Tdap vaccine greater than or equal to 7yo IM   Meds ordered this encounter  Medications  . amLODipine (NORVASC) 10 MG tablet    Sig: Take 1 tablet (10 mg total) by mouth daily.    Dispense:  90 tablet    Refill:  3    Order  Specific Question:   Supervising Provider    Answer:   Quentin AngstJEGEDE, OLUGBEMIGA E [id number]  . hydrochlorothiazide (HYDRODIURIL) 12.5 MG tablet    Sig: Take 1 tablet (12.5 mg total) by mouth daily.    Dispense:  90 tablet    Refill:  3    Order Specific Question:   Supervising Provider    Answer:   Quentin AngstJEGEDE, OLUGBEMIGA E [[id number]]    Follow-up: Return in about 4 weeks (around 12/28/2016) for full physical.   Loletta Specteroger David Gomez PA

## 2016-11-30 NOTE — Patient Instructions (Signed)
Amlodipine tablets °What is this medicine? °AMLODIPINE (am LOE di peen) is a calcium-channel blocker. It affects the amount of calcium found in your heart and muscle cells. This relaxes your blood vessels, which can reduce the amount of work the heart has to do. This medicine is used to lower high blood pressure. It is also used to prevent chest pain. °This medicine may be used for other purposes; ask your health care provider or pharmacist if you have questions. °COMMON BRAND NAME(S): Norvasc °What should I tell my health care provider before I take this medicine? °They need to know if you have any of these conditions: °-heart problems like heart failure or aortic stenosis °-liver disease °-an unusual or allergic reaction to amlodipine, other medicines, foods, dyes, or preservatives °-pregnant or trying to get pregnant °-breast-feeding °How should I use this medicine? °Take this medicine by mouth with a glass of water. Follow the directions on the prescription label. Take your medicine at regular intervals. Do not take more medicine than directed. °Talk to your pediatrician regarding the use of this medicine in children. Special care may be needed. This medicine has been used in children as young as 6. °Persons over 62 years old may have a stronger reaction to this medicine and need smaller doses. °Overdosage: If you think you have taken too much of this medicine contact a poison control center or emergency room at once. °NOTE: This medicine is only for you. Do not share this medicine with others. °What if I miss a dose? °If you miss a dose, take it as soon as you can. If it is almost time for your next dose, take only that dose. Do not take double or extra doses. °What may interact with this medicine? °-herbal or dietary supplements °-local or general anesthetics °-medicines for high blood pressure °-medicines for prostate problems °-rifampin °This list may not describe all possible interactions. Give your health  care provider a list of all the medicines, herbs, non-prescription drugs, or dietary supplements you use. Also tell them if you smoke, drink alcohol, or use illegal drugs. Some items may interact with your medicine. °What should I watch for while using this medicine? °Visit your doctor or health care professional for regular check ups. Check your blood pressure and pulse rate regularly. Ask your health care professional what your blood pressure and pulse rate should be, and when you should contact him or her. °This medicine may make you feel confused, dizzy or lightheaded. Do not drive, use machinery, or do anything that needs mental alertness until you know how this medicine affects you. To reduce the risk of dizzy or fainting spells, do not sit or stand up quickly, especially if you are an older patient. Avoid alcoholic drinks; they can make you more dizzy. °Do not suddenly stop taking amlodipine. Ask your doctor or health care professional how you can gradually reduce the dose. °What side effects may I notice from receiving this medicine? °Side effects that you should report to your doctor or health care professional as soon as possible: °-allergic reactions like skin rash, itching or hives, swelling of the face, lips, or tongue °-breathing problems °-changes in vision or hearing °-chest pain °-fast, irregular heartbeat °-swelling of legs or ankles °Side effects that usually do not require medical attention (report to your doctor or health care professional if they continue or are bothersome): °-dry mouth °-facial flushing °-nausea, vomiting °-stomach gas, pain °-tired, weak °-trouble sleeping °This list may not describe all possible side   effects. Call your doctor for medical advice about side effects. You may report side effects to FDA at 1-800-FDA-1088. °Where should I keep my medicine? °Keep out of the reach of children. °Store at room temperature between 59 and 86 degrees F (15 and 30 degrees C). Protect from  light. Keep container tightly closed. Throw away any unused medicine after the expiration date. °NOTE: This sheet is a summary. It may not cover all possible information. If you have questions about this medicine, talk to your doctor, pharmacist, or health care provider. °© 2018 [employer] Standard (2012-02-05 11:40:58) ° °

## 2016-12-01 ENCOUNTER — Telehealth (INDEPENDENT_AMBULATORY_CARE_PROVIDER_SITE_OTHER): Payer: Self-pay | Admitting: *Deleted

## 2016-12-01 LAB — BASIC METABOLIC PANEL
BUN / CREAT RATIO: 21 (ref 12–28)
BUN: 12 mg/dL (ref 8–27)
CO2: 23 mmol/L (ref 20–29)
CREATININE: 0.57 mg/dL (ref 0.57–1.00)
Calcium: 9.6 mg/dL (ref 8.7–10.3)
Chloride: 102 mmol/L (ref 96–106)
GFR calc Af Amer: 116 mL/min/{1.73_m2} (ref >59)
GFR, EST NON AFRICAN AMERICAN: 100 mL/min/{1.73_m2} (ref >59)
GLUCOSE: 94 mg/dL (ref 65–99)
Potassium: 3.6 mmol/L (ref 3.5–5.2)
SODIUM: 140 mmol/L (ref 134–144)

## 2016-12-01 NOTE — Telephone Encounter (Signed)
-----   Message from Loletta Specteroger David Gomez, PA-C sent at 12/01/2016  8:43 AM EDT ----- Potassium level is normal. Continue medications as directed.

## 2016-12-01 NOTE — Telephone Encounter (Signed)
Medical Assistant left message on patient's home and cell voicemail. Voicemail states to give a call back to [place] [facility] at [phone]. Patient is aware of potassium being normal and to continue current medications.

## 2016-12-11 DIAGNOSIS — H401132 Primary open-angle glaucoma, bilateral, moderate stage: Secondary | ICD-10-CM | POA: Diagnosis not present

## 2016-12-30 ENCOUNTER — Ambulatory Visit (INDEPENDENT_AMBULATORY_CARE_PROVIDER_SITE_OTHER): Payer: BLUE CROSS/BLUE SHIELD | Admitting: Physician Assistant

## 2016-12-30 ENCOUNTER — Ambulatory Visit: Payer: BLUE CROSS/BLUE SHIELD | Admitting: Allergy

## 2017-02-16 ENCOUNTER — Other Ambulatory Visit: Payer: Self-pay

## 2017-02-16 ENCOUNTER — Ambulatory Visit (INDEPENDENT_AMBULATORY_CARE_PROVIDER_SITE_OTHER): Payer: BLUE CROSS/BLUE SHIELD | Admitting: Physician Assistant

## 2017-02-16 ENCOUNTER — Encounter (INDEPENDENT_AMBULATORY_CARE_PROVIDER_SITE_OTHER): Payer: Self-pay | Admitting: Physician Assistant

## 2017-02-16 VITALS — BP 149/82 | HR 62 | Temp 97.8°F | Wt 122.0 lb

## 2017-02-16 DIAGNOSIS — Z1159 Encounter for screening for other viral diseases: Secondary | ICD-10-CM | POA: Diagnosis not present

## 2017-02-16 DIAGNOSIS — Z124 Encounter for screening for malignant neoplasm of cervix: Secondary | ICD-10-CM

## 2017-02-16 DIAGNOSIS — Z114 Encounter for screening for human immunodeficiency virus [HIV]: Secondary | ICD-10-CM

## 2017-02-16 DIAGNOSIS — Z1211 Encounter for screening for malignant neoplasm of colon: Secondary | ICD-10-CM | POA: Diagnosis not present

## 2017-02-16 DIAGNOSIS — Z1231 Encounter for screening mammogram for malignant neoplasm of breast: Secondary | ICD-10-CM

## 2017-02-16 DIAGNOSIS — Z0000 Encounter for general adult medical examination without abnormal findings: Secondary | ICD-10-CM

## 2017-02-16 DIAGNOSIS — Z1239 Encounter for other screening for malignant neoplasm of breast: Secondary | ICD-10-CM

## 2017-02-16 DIAGNOSIS — F5102 Adjustment insomnia: Secondary | ICD-10-CM

## 2017-02-16 MED ORDER — MELATONIN 5 MG PO TABS: 1.0000 | ORAL_TABLET | Freq: Every day | ORAL | 0 refills | Status: DC

## 2017-02-16 NOTE — Patient Instructions (Signed)

## 2017-02-16 NOTE — Progress Notes (Signed)
Subjective:  Patient ID: Rachael MiresBetty Smith, female    DOB: 25-Jan-1956  Age: 61 y.o. MRN: [id number]  CC: annual exam  HPI Rachael Smith is a 61 y.o. female with a medical history of HTN presents for an annual physical exam.  Declines pap at this time. Overall feels well but has been having trouble initiating and maintaining sleep. Feels stress as she is about to be temporarily out of work in the coming weeks. Does not endorse CP, palpitations, SOB, HA, tingling, numbness, f/c/n/v, abdominal pain, rash, swelling, or GI/GU sxs.       Outpatient Medications Prior to Visit  Medication Sig Dispense Refill  . amLODipine (NORVASC) 10 MG tablet Take 1 tablet (10 mg total) by mouth daily. 90 tablet 3  . cetirizine (ZYRTEC) 10 MG tablet Take 1 tablet (10 mg total) by mouth daily. 30 tablet 2  . diphenhydrAMINE (BENADRYL) 25 MG tablet Take 1 tablet (25 mg total) by mouth every 6 (six) hours. 20 tablet 0  . hydrochlorothiazide (HYDRODIURIL) 12.5 MG tablet Take 1 tablet (12.5 mg total) by mouth daily. 90 tablet 3  . hydrOXYzine (ATARAX/VISTARIL) 10 MG tablet Take 1 tablet (10 mg total) by mouth at bedtime. 30 tablet 1  . montelukast (SINGULAIR) 10 MG tablet Take 1 tablet (10 mg total) by mouth at bedtime. 30 tablet 5  . ranitidine (ZANTAC) 150 MG tablet Take 1 tablet (150 mg total) by mouth 2 (two) times daily. 60 tablet 5   No facility-administered medications prior to visit.      ROS Review of Systems  Constitutional: Negative for chills, fever and malaise/fatigue.  Eyes: Negative for blurred vision.  Respiratory: Negative for shortness of breath.   Cardiovascular: Negative for chest pain and palpitations.  Gastrointestinal: Negative for abdominal pain and nausea.  Genitourinary: Negative for dysuria and hematuria.  Musculoskeletal: Negative for joint pain and myalgias.  Skin: Negative for rash.  Neurological: Negative for tingling and headaches.  Psychiatric/Behavioral: Negative for  depression. The patient is not nervous/anxious.     Objective:     Physical Exam  Constitutional: She is oriented to person, place, and time.  Well developed, well nourished, NAD, polite  HENT:  Head: Normocephalic and atraumatic.  Mouth/Throat: Oropharynx is clear and moist. No oropharyngeal exudate.  Eyes: Conjunctivae and EOM are normal. Pupils are equal, round, and reactive to light. No scleral icterus.  Neck: Normal range of motion. Neck supple. No thyromegaly present.  Cardiovascular: Normal rate, regular rhythm and normal heart sounds. Exam reveals no gallop and no friction rub.  No murmur heard. Pulmonary/Chest: Effort normal and breath sounds normal. No respiratory distress. She has no wheezes. She has no rales.  Abdominal: Soft. Bowel sounds are normal. She exhibits no distension and no mass. There is no tenderness. There is no rebound and no guarding.  No hepatomegaly  Genitourinary:  Genitourinary Comments: Pt declined  Musculoskeletal: She exhibits no edema.  UEs, LEs, and back with full aROM.  Lymphadenopathy:    She has no cervical adenopathy.  Neurological: She is alert and oriented to person, place, and time. No cranial nerve deficit. Coordination normal.  Left lower eyelid twitch  Skin: Skin is warm and dry. No rash noted. No erythema. No pallor.  Psychiatric: She has a normal mood and affect. Her behavior is normal. Thought content normal.  Vitals reviewed.    Assessment & Plan:    1. Annual physical exam - Comprehensive metabolic panel - CBC with Differential - Lipid panel - TSH  2. Need for hepatitis C screening test - Hepatitis c antibody (reflex)  3. Screening for HIV (human immunodeficiency virus) - HIV antibody  4. Screening for cervical cancer - Patient declined PAP smear at this time. Says she will call to have PAP smear done at a later time.  5. Screening for breast cancer - [place] DIGITAL SCREENING BILATERAL; Future  6. Special screening  for malignant neoplasms, colon - Fecal occult blood, imunochemical  7. Insomnia due to stress - Begin Melatonin 5 mg, take one tablet 3-4 hours before bedtime.    Meds ordered this encounter  Medications  . Melatonin 5 MG TABS    Sig: Take 1 tablet (5 mg total) by mouth daily.    Dispense:  30 tablet    Refill:  0    Order Specific Question:   Supervising Provider    Answer:   Quentin AngstJEGEDE, OLUGBEMIGA E [[id number]]    Follow-up: Return in about 2 weeks (around 03/02/2017) for flu vaccination.and Zostavax  Loletta Specteroger David Salem Lembke PA

## 2017-02-17 ENCOUNTER — Other Ambulatory Visit (INDEPENDENT_AMBULATORY_CARE_PROVIDER_SITE_OTHER): Payer: Self-pay | Admitting: Physician Assistant

## 2017-02-17 DIAGNOSIS — E7841 Elevated Lipoprotein(a): Secondary | ICD-10-CM

## 2017-02-17 LAB — COMPREHENSIVE METABOLIC PANEL
ALK PHOS: 88 IU/L (ref 39–117)
ALT: 12 IU/L (ref 0–32)
AST: 21 IU/L (ref 0–40)
Albumin/Globulin Ratio: 1.8 (ref 1.2–2.2)
Albumin: 4.6 g/dL (ref 3.6–4.8)
BILIRUBIN TOTAL: 0.2 mg/dL (ref 0.0–1.2)
BUN/Creatinine Ratio: 16 (ref 12–28)
BUN: 11 mg/dL (ref 8–27)
CHLORIDE: 101 mmol/L (ref 96–106)
CO2: 24 mmol/L (ref 20–29)
CREATININE: 0.68 mg/dL (ref 0.57–1.00)
Calcium: 9.4 mg/dL (ref 8.7–10.3)
GFR calc Af Amer: 108 mL/min/{1.73_m2} (ref >59)
GFR calc non Af Amer: 94 mL/min/{1.73_m2} (ref >59)
GLOBULIN, TOTAL: 2.6 g/dL (ref 1.5–4.5)
GLUCOSE: 96 mg/dL (ref 65–99)
Potassium: 3.6 mmol/L (ref 3.5–5.2)
SODIUM: 140 mmol/L (ref 134–144)
Total Protein: 7.2 g/dL (ref 6.0–8.5)

## 2017-02-17 LAB — CBC WITH DIFFERENTIAL/PLATELET
BASOS ABS: 0 10*3/uL (ref 0.0–0.2)
Basos: 0 %
EOS (ABSOLUTE): 0.4 10*3/uL (ref 0.0–0.4)
EOS: 6 %
HEMATOCRIT: 33.8 % — AB (ref 34.0–46.6)
Hemoglobin: 11.1 g/dL (ref 11.1–15.9)
IMMATURE GRANULOCYTES: 0 %
Immature Grans (Abs): 0 10*3/uL (ref 0.0–0.1)
Lymphocytes Absolute: 3.4 10*3/uL — ABNORMAL HIGH (ref 0.7–3.1)
Lymphs: 50 %
MCH: 27.8 pg (ref 26.6–33.0)
MCHC: 32.8 g/dL (ref 31.5–35.7)
MCV: 85 fL (ref 79–97)
MONOS ABS: 0.8 10*3/uL (ref 0.1–0.9)
Monocytes: 11 %
NEUTROS PCT: 33 %
Neutrophils Absolute: 2.3 10*3/uL (ref 1.4–7.0)
PLATELETS: 325 10*3/uL (ref 150–379)
RBC: 3.99 x10E6/uL (ref 3.77–5.28)
RDW: 14.2 % (ref 12.3–15.4)
WBC: 6.9 10*3/uL (ref 3.4–10.8)

## 2017-02-17 LAB — LIPID PANEL
CHOLESTEROL TOTAL: 234 mg/dL — AB (ref 100–199)
Chol/HDL Ratio: 4.3 ratio (ref 0.0–4.4)
HDL: 55 mg/dL (ref >39)
LDL Calculated: 153 mg/dL — ABNORMAL HIGH (ref 0–99)
TRIGLYCERIDES: 130 mg/dL (ref 0–149)
VLDL Cholesterol Cal: 26 mg/dL (ref 5–40)

## 2017-02-17 LAB — HIV ANTIBODY (ROUTINE TESTING W REFLEX): HIV SCREEN 4TH GENERATION: NONREACTIVE

## 2017-02-17 LAB — TSH: TSH: 1.76 u[IU]/mL (ref 0.450–4.500)

## 2017-02-17 LAB — HCV COMMENT:

## 2017-02-17 LAB — HEPATITIS C ANTIBODY (REFLEX): HCV Ab: <0.1 s/co ratio (ref 0.0–0.9)

## 2017-02-17 MED ORDER — ATORVASTATIN CALCIUM 40 MG PO TABS
40.0000 mg | ORAL_TABLET | Freq: Every day | ORAL | 3 refills | Status: DC
Start: 2017-02-17 — End: 2017-12-20

## 2017-02-18 ENCOUNTER — Telehealth (INDEPENDENT_AMBULATORY_CARE_PROVIDER_SITE_OTHER): Payer: Self-pay

## 2017-02-18 NOTE — Telephone Encounter (Signed)
-----   Message from Loletta Specteroger David Gomez, PA-C sent at 02/17/2017  1:43 PM EST ----- Cholesterol elevated. I am sending statin to [facility] on  [address].

## 2017-02-18 NOTE — Telephone Encounter (Signed)
Patient aware of elevated cholesterol and statin being sent to pharmacy. Maryjean Mornempestt S Roberts, CMA

## 2017-02-22 ENCOUNTER — Telehealth (INDEPENDENT_AMBULATORY_CARE_PROVIDER_SITE_OTHER): Payer: Self-pay | Admitting: Physician Assistant

## 2017-02-22 NOTE — Telephone Encounter (Signed)
Mrs Rachael Smith want to talk to you  Regarding her meds refilll . Please, call her back at [phone]. Thank You .

## 2017-04-19 ENCOUNTER — Other Ambulatory Visit: Payer: Self-pay

## 2017-04-19 DIAGNOSIS — T783XXD Angioneurotic edema, subsequent encounter: Secondary | ICD-10-CM

## 2017-04-19 DIAGNOSIS — L508 Other urticaria: Secondary | ICD-10-CM

## 2017-04-19 MED ORDER — MONTELUKAST SODIUM 10 MG PO TABS: 10.0000 mg | ORAL_TABLET | Freq: Every day | ORAL | 0 refills | Status: DC

## 2017-04-19 NOTE — Telephone Encounter (Signed)
Courtesy refill sent for Singulair. 30 day supply sent.

## 2017-07-22 DIAGNOSIS — H401132 Primary open-angle glaucoma, bilateral, moderate stage: Secondary | ICD-10-CM | POA: Diagnosis not present

## 2017-07-25 ENCOUNTER — Other Ambulatory Visit: Payer: Self-pay | Admitting: Allergy

## 2017-07-25 DIAGNOSIS — L508 Other urticaria: Secondary | ICD-10-CM

## 2017-07-25 DIAGNOSIS — T783XXD Angioneurotic edema, subsequent encounter: Secondary | ICD-10-CM

## 2017-08-17 DIAGNOSIS — H401122 Primary open-angle glaucoma, left eye, moderate stage: Secondary | ICD-10-CM | POA: Diagnosis not present

## 2017-09-03 DIAGNOSIS — H401122 Primary open-angle glaucoma, left eye, moderate stage: Secondary | ICD-10-CM | POA: Diagnosis not present

## 2017-12-20 ENCOUNTER — Ambulatory Visit (INDEPENDENT_AMBULATORY_CARE_PROVIDER_SITE_OTHER): Payer: BLUE CROSS/BLUE SHIELD | Admitting: Physician Assistant

## 2017-12-20 ENCOUNTER — Encounter (INDEPENDENT_AMBULATORY_CARE_PROVIDER_SITE_OTHER): Payer: Self-pay | Admitting: Physician Assistant

## 2017-12-20 VITALS — BP 174/81 | HR 63 | Temp 97.9°F | Wt 118.6 lb

## 2017-12-20 DIAGNOSIS — Z1211 Encounter for screening for malignant neoplasm of colon: Secondary | ICD-10-CM | POA: Diagnosis not present

## 2017-12-20 DIAGNOSIS — Z23 Encounter for immunization: Secondary | ICD-10-CM | POA: Diagnosis not present

## 2017-12-20 DIAGNOSIS — I10 Essential (primary) hypertension: Secondary | ICD-10-CM | POA: Diagnosis not present

## 2017-12-20 MED ORDER — HYDROCHLOROTHIAZIDE 25 MG PO TABS: 25.0000 mg | ORAL_TABLET | Freq: Every day | ORAL | 1 refills | Status: DC

## 2017-12-20 MED ORDER — ATORVASTATIN CALCIUM 40 MG PO TABS: 40.0000 mg | ORAL_TABLET | Freq: Every day | ORAL | 1 refills | Status: DC

## 2017-12-20 MED ORDER — AMLODIPINE BESYLATE 10 MG PO TABS: 10.0000 mg | ORAL_TABLET | Freq: Every day | ORAL | 1 refills | Status: DC

## 2017-12-20 NOTE — Patient Instructions (Signed)

## 2017-12-20 NOTE — Progress Notes (Signed)
Subjective:  Patient ID: Rachael Smith, female    DOB: March 02, 1956  Age: 62 y.o. MRN: [id number]  CC: headache  HPI  Evalee Gerard is a 62 y.o. female with a medical history of HTN presents for management of HTN. Last BP 149/82 mmHg while on HCTZ 12.5 mg and Amlodipine 10 mg. She is currently only taking HCTZ 12.5mg . Has experienced occasional headache, occasional exertional dyspnea, occasional lightheadedness, and tingling of the bilateral foot. Does not endorse CP, palpitations, SOB, weakness, paralysis, numbness, presyncope, syncope, abdominal pain, f/c/n/v, rash, swelling, or GI/GU sxs.    Outpatient Medications Prior to Visit  Medication Sig Dispense Refill  . amLODipine (NORVASC) 10 MG tablet Take 1 tablet (10 mg total) by mouth daily. 90 tablet 3  . hydrochlorothiazide (HYDRODIURIL) 12.5 MG tablet Take 1 tablet (12.5 mg total) by mouth daily. 90 tablet 3  . atorvastatin (LIPITOR) 40 MG tablet Take 1 tablet (40 mg total) by mouth at bedtime. (Patient not taking: Reported on 12/20/2017) 90 tablet 3  . cetirizine (ZYRTEC) 10 MG tablet Take 1 tablet (10 mg total) by mouth daily. (Patient not taking: Reported on 12/20/2017) 30 tablet 2  . diphenhydrAMINE (BENADRYL) 25 MG tablet Take 1 tablet (25 mg total) by mouth every 6 (six) hours. 20 tablet 0  . hydrOXYzine (ATARAX/VISTARIL) 10 MG tablet Take 1 tablet (10 mg total) by mouth at bedtime. (Patient not taking: Reported on 12/20/2017) 30 tablet 1  . Melatonin 5 MG TABS Take 1 tablet (5 mg total) by mouth daily. (Patient not taking: Reported on 12/20/2017) 30 tablet 0  . montelukast (SINGULAIR) 10 MG tablet Take 1 tablet (10 mg total) by mouth at bedtime. (Patient not taking: Reported on 12/20/2017) 30 tablet 0  . ranitidine (ZANTAC) 150 MG tablet Take 1 tablet (150 mg total) by mouth 2 (two) times daily. (Patient not taking: Reported on 12/20/2017) 60 tablet 5   No facility-administered medications prior to visit.      ROS Review of  Systems  Constitutional: Negative for chills, fever and malaise/fatigue.  Eyes: Negative for blurred vision.  Respiratory: Negative for shortness of breath.   Cardiovascular: Negative for chest pain and palpitations.  Gastrointestinal: Negative for abdominal pain and nausea.  Genitourinary: Negative for dysuria and hematuria.  Musculoskeletal: Negative for joint pain and myalgias.  Skin: Negative for rash.  Neurological: Positive for tingling and headaches.  Psychiatric/Behavioral: Negative for depression. The patient is not nervous/anxious.     Objective:  BP (!) 174/81   Pulse 63   Temp 97.9 F (36.6 C) (Oral)   Wt 118 lb 9.6 oz (53.8 kg)   SpO2 99%   BMI 21.69 kg/m   BP/Weight 12/20/2017 02/16/2017 11/30/2016  Systolic BP 174 149 110  Diastolic BP 81 82 71  Wt. (Lbs) 118.6 122 121  BMI 21.69 22.31 22.13      Physical Exam  Constitutional: She is oriented to person, place, and time.  Well developed, well nourished, NAD, polite  HENT:  Head: Normocephalic and atraumatic.  Eyes: No scleral icterus.  Neck: Normal range of motion. Neck supple. No thyromegaly present.  Cardiovascular: Normal rate, regular rhythm and normal heart sounds.  Pulmonary/Chest: Effort normal and breath sounds normal.  Abdominal: Soft. Bowel sounds are normal. There is no tenderness.  Musculoskeletal: She exhibits no edema.  Neurological: She is alert and oriented to person, place, and time.  Skin: Skin is warm and dry. No rash noted. No erythema. No pallor.  Psychiatric: She has a normal  mood and affect. Her behavior is normal. Thought content normal.  Vitals reviewed.    Assessment & Plan:    1. Hypertension, unspecified type - Basic Metabolic Panel - Refill amLODipine (NORVASC) 10 MG tablet; Take 1 tablet (10 mg total) by mouth daily.  Dispense: 90 tablet; Refill: 1 - Increase HCTZ to 25 mg po qam  2. Special screening for malignant neoplasms, colon - Fecal occult blood,  imunochemical  3. Need for prophylactic vaccination and inoculation against influenza - Administered influenza in clinic today     Meds ordered this encounter  Medications  . hydrochlorothiazide (HYDRODIURIL) 25 MG tablet    Sig: Take 1 tablet (25 mg total) by mouth daily. Take on tablet in the morning.    Dispense:  90 tablet    Refill:  1    Order Specific Question:   Supervising Provider    Answer:   Hoy Register [[id number]]  . amLODipine (NORVASC) 10 MG tablet    Sig: Take 1 tablet (10 mg total) by mouth daily.    Dispense:  90 tablet    Refill:  1    Order Specific Question:   Supervising Provider    Answer:   Hoy Register [[id number]]  . atorvastatin (LIPITOR) 40 MG tablet    Sig: Take 1 tablet (40 mg total) by mouth at bedtime.    Dispense:  90 tablet    Refill:  1    Order Specific Question:   Supervising Provider    Answer:   Hoy Register [[id number]]    Follow-up: Return in about 8 weeks (around 02/14/2018) for Annual exam, pap, and BP.   Loletta Specter PA

## 2018-01-31 ENCOUNTER — Encounter (INDEPENDENT_AMBULATORY_CARE_PROVIDER_SITE_OTHER): Payer: Self-pay | Admitting: Physician Assistant

## 2018-01-31 ENCOUNTER — Other Ambulatory Visit: Payer: Self-pay

## 2018-01-31 ENCOUNTER — Ambulatory Visit (INDEPENDENT_AMBULATORY_CARE_PROVIDER_SITE_OTHER): Payer: BLUE CROSS/BLUE SHIELD | Admitting: Physician Assistant

## 2018-01-31 VITALS — BP 159/97 | HR 74 | Temp 98.3°F | Ht 62.0 in | Wt 117.2 lb

## 2018-01-31 DIAGNOSIS — J069 Acute upper respiratory infection, unspecified: Secondary | ICD-10-CM | POA: Diagnosis not present

## 2018-01-31 DIAGNOSIS — R1030 Lower abdominal pain, unspecified: Secondary | ICD-10-CM | POA: Diagnosis not present

## 2018-01-31 DIAGNOSIS — R5383 Other fatigue: Secondary | ICD-10-CM | POA: Diagnosis not present

## 2018-01-31 DIAGNOSIS — R5381 Other malaise: Secondary | ICD-10-CM | POA: Diagnosis not present

## 2018-01-31 DIAGNOSIS — R8281 Pyuria: Secondary | ICD-10-CM

## 2018-01-31 DIAGNOSIS — R8290 Unspecified abnormal findings in urine: Secondary | ICD-10-CM | POA: Diagnosis not present

## 2018-01-31 LAB — POCT URINALYSIS DIPSTICK
Bilirubin, UA: NEGATIVE
GLUCOSE UA: NEGATIVE
Ketones, UA: NEGATIVE
NITRITE UA: NEGATIVE
PH UA: 7.5 (ref 5.0–8.0)
PROTEIN UA: POSITIVE — AB
SPEC GRAV UA: 1.015 (ref 1.010–1.025)
Urobilinogen, UA: 4 E.U./dL — AB

## 2018-01-31 MED ORDER — AMOXICILLIN-POT CLAVULANATE 875-125 MG PO TABS: 1.0000 | ORAL_TABLET | Freq: Two times a day (BID) | ORAL | 0 refills | Status: AC

## 2018-01-31 MED ORDER — PHENYLEPHRINE-DM-GG-APAP 5-10-200-325 MG/10ML PO LIQD: 20.0000 mL | ORAL | 0 refills | Status: AC

## 2018-01-31 NOTE — Progress Notes (Signed)
Subjective:  Patient ID: Rachael Smith, female    DOB: 02/12/1956  Age: 62 y.o. MRN: [id number]  CC: flu like symptoms  HPI Rachael Smith a 62 y.o.femalewith a medical history of HTN presents with three day history sore throat, headache, body aches, malaise, fatigue, chills, fever, nasal congestion, mild cough, sinus pain, and lower abdominal pain. Has taken tylenol cold and flu severe with no relief of symptoms. No close contacts with the same. Received influenza vaccine 12/20/17. Does not endorse rash, swelling, CP, palpitations, SOB, or GI/GU sxs.     Outpatient Medications Prior to Visit  Medication Sig Dispense Refill  . amLODipine (NORVASC) 10 MG tablet Take 1 tablet (10 mg total) by mouth daily. 90 tablet 1  . atorvastatin (LIPITOR) 40 MG tablet Take 1 tablet (40 mg total) by mouth at bedtime. 90 tablet 1  . hydrochlorothiazide (HYDRODIURIL) 25 MG tablet Take 1 tablet (25 mg total) by mouth daily. Take on tablet in the morning. 90 tablet 1  . diphenhydrAMINE (BENADRYL) 25 MG tablet Take 1 tablet (25 mg total) by mouth every 6 (six) hours. 20 tablet 0   No facility-administered medications prior to visit.      ROS Review of Systems  Constitutional: Negative for chills, fever and malaise/fatigue.  Eyes: Negative for blurred vision.  Respiratory: Negative for shortness of breath.   Cardiovascular: Negative for chest pain and palpitations.  Gastrointestinal: Negative for abdominal pain and nausea.  Genitourinary: Negative for dysuria and hematuria.  Musculoskeletal: Negative for joint pain and myalgias.  Skin: Negative for rash.  Neurological: Negative for tingling and headaches.  Psychiatric/Behavioral: Negative for depression. The patient is not nervous/anxious.     Objective:  BP (!) 159/97 (BP Location: Left Arm, Patient Position: Sitting, Cuff Size: Normal)   Pulse 74   Temp 98.3 F (36.8 C) (Oral)   Ht 5\' 2"  (1.575 m)   Wt 117 lb 3.2 oz (53.2 kg)    SpO2 94%   BMI 21.44 kg/m   BP/Weight 01/31/2018 12/20/2017 02/16/2017  Systolic BP 159 174 149  Diastolic BP 97 81 82  Wt. (Lbs) 117.2 118.6 122  BMI 21.44 21.69 22.31      Physical Exam  Constitutional: She is oriented to person, place, and time.  Well developed, well nourished, mildly ill appearing, not toxic, polite  HENT:  Head: Normocephalic and atraumatic.  Eyes: No scleral icterus.  Neck: Normal range of motion. Neck supple. No thyromegaly present.  Cardiovascular: Normal rate, regular rhythm and normal heart sounds.  Pulmonary/Chest: Effort normal and breath sounds normal.  Abdominal: Soft. Bowel sounds are normal. There is tenderness (mild suprapubic TTP).  Musculoskeletal: She exhibits no edema.  Neurological: She is alert and oriented to person, place, and time.  Skin: Skin is warm and dry. No rash noted. No erythema. No pallor.  Psychiatric: She has a normal mood and affect. Her behavior is normal. Thought content normal.  Vitals reviewed.    Assessment & Plan:   1. Acute upper respiratory infection - Begin Phenylephrine-DM-GG-APAP (MUCINEX FAST-MAX COLD FLU) 5-10-200-325 MG/10ML LIQD; Take 20 mLs by mouth every 4 (four) hours for 5 days.  Dispense: 1 Bottle; Refill: 0  2. Malaise and fatigue - Comprehensive metabolic panel - CBC with Differential - Begin amoxicillin-clavulanate (AUGMENTIN) 875-125 MG tablet; Take 1 tablet by mouth 2 (two) times daily for 10 days.  Dispense: 20 tablet; Refill: 0 - Begin Phenylephrine-DM-GG-APAP (MUCINEX FAST-MAX COLD FLU) 5-10-200-325 MG/10ML LIQD; Take 20 mLs by mouth every 4 (  four) hours for 5 days.  Dispense: 1 Bottle; Refill: 0  3. Lower abdominal pain - Urinalysis Dipstick - Urine Culture - Comprehensive metabolic panel - CBC with Differential - Begin amoxicillin-clavulanate (AUGMENTIN) 875-125 MG tablet; Take 1 tablet by mouth 2 (two) times daily for 10 days.  Dispense: 20 tablet; Refill: 0  4. Pyuria - Urine  Culture - Begin amoxicillin-clavulanate (AUGMENTIN) 875-125 MG tablet; Take 1 tablet by mouth 2 (two) times daily for 10 days.  Dispense: 20 tablet; Refill: 0  5. Abnormal urinalysis - Lactate Dehydrogenase - CBC with Differential   Meds ordered this encounter  Medications  . amoxicillin-clavulanate (AUGMENTIN) 875-125 MG tablet    Sig: Take 1 tablet by mouth 2 (two) times daily for 10 days.    Dispense:  20 tablet    Refill:  0    Order Specific Question:   Supervising Provider    Answer:   Hoy Register [[id number]]  . Phenylephrine-DM-GG-APAP (MUCINEX FAST-MAX COLD FLU) 5-10-200-325 MG/10ML LIQD    Sig: Take 20 mLs by mouth every 4 (four) hours for 5 days.    Dispense:  1 Bottle    Refill:  0    Order Specific Question:   Supervising Provider    Answer:   Hoy Register [[id number]]    Follow-up: Return if symptoms worsen or fail to improve.   Loletta Specter PA

## 2018-01-31 NOTE — Patient Instructions (Signed)
Upper Respiratory Infection, Adult Most upper respiratory infections (URIs) are caused by a virus. A URI affects the nose, throat, and upper air passages. The most common type of URI is often called "the common cold." Follow these instructions at home:  Take medicines only as told by your doctor.  Gargle warm saltwater or take cough drops to comfort your throat as told by your doctor.  Use a warm mist humidifier or inhale steam from a shower to increase air moisture. This may make it easier to breathe.  Drink enough fluid to keep your pee (urine) clear or pale yellow.  Eat soups and other clear broths.  Have a healthy diet.  Rest as needed.  Go back to work when your fever is gone or your doctor says it is okay. ? You may need to stay home longer to avoid giving your URI to others. ? You can also wear a face mask and wash your hands often to prevent spread of the virus.  Use your inhaler more if you have asthma.  Do not use any tobacco products, including cigarettes, chewing tobacco, or electronic cigarettes. If you need help quitting, ask your doctor. Contact a doctor if:  You are getting worse, not better.  Your symptoms are not helped by medicine.  You have chills.  You are getting more short of breath.  You have brown or red mucus.  You have yellow or brown discharge from your nose.  You have pain in your face, especially when you bend forward.  You have a fever.  You have puffy (swollen) neck glands.  You have pain while swallowing.  You have white areas in the back of your throat. Get help right away if:  You have very bad or constant: ? Headache. ? Ear pain. ? Pain in your forehead, behind your eyes, and over your cheekbones (sinus pain). ? Chest pain.  You have long-lasting (chronic) lung disease and any of the following: ? Wheezing. ? Long-lasting cough. ? Coughing up blood. ? A change in your usual mucus.  You have a stiff neck.  You have  changes in your: ? Vision. ? Hearing. ? Thinking. ? Mood. This information is not intended to replace advice given to you by your health care provider. Make sure you discuss any questions you have with your health care provider. Document Released: 08/26/2007 Document Revised: 11/10/2015 Document Reviewed: 06/14/2013 Elsevier Interactive Patient Education  2018 Elsevier Inc.  

## 2018-02-01 ENCOUNTER — Telehealth (INDEPENDENT_AMBULATORY_CARE_PROVIDER_SITE_OTHER): Payer: Self-pay

## 2018-02-01 ENCOUNTER — Other Ambulatory Visit (INDEPENDENT_AMBULATORY_CARE_PROVIDER_SITE_OTHER): Payer: Self-pay | Admitting: Physician Assistant

## 2018-02-01 LAB — CBC WITH DIFFERENTIAL/PLATELET
BASOS: 0 %
Basophils Absolute: 0 10*3/uL (ref 0.0–0.2)
EOS (ABSOLUTE): 0.8 10*3/uL — ABNORMAL HIGH (ref 0.0–0.4)
EOS: 9 %
HEMATOCRIT: 38.2 % (ref 34.0–46.6)
HEMOGLOBIN: 12.8 g/dL (ref 11.1–15.9)
Immature Grans (Abs): 0 10*3/uL (ref 0.0–0.1)
Immature Granulocytes: 0 %
Lymphocytes Absolute: 2.4 10*3/uL (ref 0.7–3.1)
Lymphs: 25 %
MCH: 27.8 pg (ref 26.6–33.0)
MCHC: 33.5 g/dL (ref 31.5–35.7)
MCV: 83 fL (ref 79–97)
MONOCYTES: 10 %
Monocytes Absolute: 1 10*3/uL — ABNORMAL HIGH (ref 0.1–0.9)
NEUTROS PCT: 56 %
Neutrophils Absolute: 5.3 10*3/uL (ref 1.4–7.0)
Platelets: 315 10*3/uL (ref 150–450)
RBC: 4.6 x10E6/uL (ref 3.77–5.28)
RDW: 12.4 % (ref 12.3–15.4)
WBC: 9.5 10*3/uL (ref 3.4–10.8)

## 2018-02-01 LAB — COMPREHENSIVE METABOLIC PANEL
ALT: 24 IU/L (ref 0–32)
AST: 22 IU/L (ref 0–40)
Albumin/Globulin Ratio: 1.3 (ref 1.2–2.2)
Albumin: 4.2 g/dL (ref 3.6–4.8)
Alkaline Phosphatase: 110 IU/L (ref 39–117)
BUN/Creatinine Ratio: 10 — ABNORMAL LOW (ref 12–28)
BUN: 7 mg/dL — ABNORMAL LOW (ref 8–27)
Bilirubin Total: 0.2 mg/dL (ref 0.0–1.2)
CALCIUM: 9.6 mg/dL (ref 8.7–10.3)
CO2: 23 mmol/L (ref 20–29)
CREATININE: 0.7 mg/dL (ref 0.57–1.00)
Chloride: 102 mmol/L (ref 96–106)
GFR, EST AFRICAN AMERICAN: 107 mL/min/{1.73_m2} (ref >59)
GFR, EST NON AFRICAN AMERICAN: 93 mL/min/{1.73_m2} (ref >59)
GLOBULIN, TOTAL: 3.2 g/dL (ref 1.5–4.5)
GLUCOSE: 96 mg/dL (ref 65–99)
Potassium: 4.1 mmol/L (ref 3.5–5.2)
SODIUM: 140 mmol/L (ref 134–144)
TOTAL PROTEIN: 7.4 g/dL (ref 6.0–8.5)

## 2018-02-01 LAB — LACTATE DEHYDROGENASE: LDH: 184 IU/L (ref 119–226)

## 2018-02-01 NOTE — Telephone Encounter (Signed)
Patient is aware that labs are normal except for signs of malnutrition. PCP recommends to supplement diet with protein shakes. Patient also instructed to come pick up letter that PCP has written to take her out of work due to URI. Maryjean Mornempestt S , CMA

## 2018-02-01 NOTE — Telephone Encounter (Signed)
-----   Message from Loletta Specteroger David Gomez, PA-C sent at 02/01/2018  8:42 AM EST ----- Normal labs except for signs of malnutrition. I would recommend to supplement diet with protein shakes.

## 2018-02-02 ENCOUNTER — Telehealth (INDEPENDENT_AMBULATORY_CARE_PROVIDER_SITE_OTHER): Payer: Self-pay

## 2018-02-02 LAB — URINE CULTURE

## 2018-02-02 NOTE — Telephone Encounter (Signed)
Patient is aware that there is no UTI. Still recommended to take antibiotics because she has upper respiratory symptoms. Patient expressed understanding. Rachael Smith, CMA

## 2018-02-02 NOTE — Telephone Encounter (Signed)
-----   Message from Loletta Specteroger David Gomez, PA-C sent at 02/02/2018 12:12 PM EST ----- No UTI. However, I recommend she take her antibiotic because she also has upper respiratory symptoms.

## 2018-02-21 ENCOUNTER — Other Ambulatory Visit: Payer: Self-pay

## 2018-02-21 ENCOUNTER — Ambulatory Visit (INDEPENDENT_AMBULATORY_CARE_PROVIDER_SITE_OTHER): Payer: BLUE CROSS/BLUE SHIELD | Admitting: Physician Assistant

## 2018-02-21 ENCOUNTER — Encounter (INDEPENDENT_AMBULATORY_CARE_PROVIDER_SITE_OTHER): Payer: Self-pay | Admitting: Physician Assistant

## 2018-02-21 VITALS — BP 159/99 | HR 61 | Temp 97.9°F | Ht 62.0 in | Wt 118.6 lb

## 2018-02-21 DIAGNOSIS — M546 Pain in thoracic spine: Secondary | ICD-10-CM | POA: Diagnosis not present

## 2018-02-21 DIAGNOSIS — Z1211 Encounter for screening for malignant neoplasm of colon: Secondary | ICD-10-CM | POA: Diagnosis not present

## 2018-02-21 DIAGNOSIS — I10 Essential (primary) hypertension: Secondary | ICD-10-CM

## 2018-02-21 DIAGNOSIS — Z1239 Encounter for other screening for malignant neoplasm of breast: Secondary | ICD-10-CM

## 2018-02-21 DIAGNOSIS — R0789 Other chest pain: Secondary | ICD-10-CM | POA: Diagnosis not present

## 2018-02-21 DIAGNOSIS — F418 Other specified anxiety disorders: Secondary | ICD-10-CM

## 2018-02-21 MED ORDER — CLONIDINE HCL 0.1 MG PO TABS
0.1000 mg | ORAL_TABLET | Freq: Once | ORAL | Status: AC
  Administered 2018-02-21: 0.1 mg via ORAL

## 2018-02-21 MED ORDER — ASPIRIN EC 81 MG PO TBEC: 81.0000 mg | DELAYED_RELEASE_TABLET | Freq: Every day | ORAL | 3 refills | Status: DC

## 2018-02-21 MED ORDER — ESCITALOPRAM OXALATE 10 MG PO TABS: 10.0000 mg | ORAL_TABLET | Freq: Every day | ORAL | 2 refills | Status: DC

## 2018-02-21 MED ORDER — NITROGLYCERIN 0.4 MG SL SUBL: 0.4000 mg | SUBLINGUAL_TABLET | SUBLINGUAL | 2 refills | Status: DC | PRN

## 2018-02-21 NOTE — Patient Instructions (Signed)
[name] is a Child psychotherapist that may be helpful to you in regards to linking you with community resources.    Community Resources  Advocacy/Legal Legal Aid [place]:  9316086814  /  [phone]  Family Justice Center:  [phone]  Family Service of the [facility] 24-hr Crisis line:  [phone]  [facility], [place]:  [phone]  Court Watch (custody):  [phone]  [employer] Law Clinic:   [phone]    Baby & Breastfeeding Car Seat Inspection @ Various [place] Equities trader.- call [phone]  [facility] Health Lactation  [phone]  [facility] Regional Lactation [phone]  WIC: [phone] ([place]);  [phone] ([place])  [place] League:  [phone]   Childcare [place] Child Development: [phone] [facility]) / [phone] ([place])  - Child Care Resources/ Referrals/ Scholarships  - Head Start/ Early Head Start (call or apply online)  [place] DHHS: [place] Pre-K :  [phone] / [phone]   Employment / Job Search [employer] of [place]: [phone] / [address]  [place] [employer] (JobLink): 7163226576 ([place]) / [phone] ([place])  [employer], materials Center: [phone] / [phone]  [facility] Job & Career Center: [phone]  DHHS Work First: [phone] ([place]) / [phone] ([place])  [facility] [facility] [place]:  ([phone]   Financial Assistance [place] Ministry:  (902)859-7122  [employer]: 217-376-6082  [employer] Network (furniture):  815-041-8605  [facility] Helping Hands: [phone]  Low Income Energy Assistance  (579)249-2092   Food Assistance DHHS- SNAP/ Food Stamps: 218-083-5288  WIC: [place] Mason(859)[phone] ;  [place] [phone]  [place] Book- Free Meals  Little Blue Book- Free Food Pantries  During the summer, text "FOOD" to [phone]   General Health / Clinics (Adults) Orange Card (for Adults) through [facility]: [phone]  [place] Family  [facility]:   [phone]  [facility] Health & Wellness:   [phone]  Health Department:  [phone]  [facility]:  [phone] / [phone]  Planned Parenthood of [place]:   [phone]  [facility]:   [phone]   Housing [place] Housing Coalition:   2238215432  [facility] Housing Authority:  [phone]  Affordable Housing Managemnt:  (865)319-0412   Immigrant/ Refugee Center for [facility] [place]):  (507)[phone]  Faith [employer] [facility]:  (657) [phone]  [employer]:  8078685194  Parks Ranger Services:  3462434046  African Services Coalition:  812-828-5088   LGBTQ YouthSAFE  www.youthsafe[place].org  PFLAG  975-883-2549 / Peggyann Juba  The [employer] Project:  (514)401-2718   Mental Health/ Substance Use Family Service of the [facility]  [phone]  [facility] Behavioral Health:  480-785-8345 or [phone]  [facility] of Care:  [phone]  Journeys Counseling:  [phone]  [facility] Care Services:  [phone]  [place] Mixer (walk-ins)  786-454-2867 / [address]  [place]:  [phone]  Alcoholics Anonymous:  7374042080  Narcotics Anonymous:  571-648-0074  Quit Smoking Hotline:  800-QUIT-NOW 801-269-1019)   Parenting Children's Home Society:  (248)218-7916  [facility] Health: Education Center & Support Groups:  717-881-1432  YWCA: 615-655-0889  [employer]: Bringing Out the Best:  (445)740-5868               Thriving at Three (Hispanic families): 308-337-4021  Healthy Start (Family Service of the [place]):  501-762-8388 x2288  Parents as Teachers:  563-486-2031  [place] Child Development- Learning Together (Immigrants): 918-448-3308   Poison Control (252) [phone]  Sports & Recreation [employer] Open Doors Application: https://www.rich.com/  [place] of [place] Recreation Centers: http://www.[place].gov/index.aspx?page=3615   Special Needs Family  Support  Network:  2248559388416-176-8592  Autism Society of :   [phone] [phone] or ([phone] /  [phone]  [facility] [place]:  [phone]  ARC of [place]:  [phone]  Children's Developmental Service Agency (CDSA):  [phone]  [facility] (Care Coordination for Children):  ([phone]   Transportation Medicaid Transportation: 579-555-9974(860)286-4561 to apply  [place] Transit Authority: [phone])232-6909 (reduced-fare bus ID to Medicaid/ Medicare/ Orange Card)  SCAT Paratransit services: Eligible riders only, call [phone] for application   Tutoring/Mentoring Black Child Development Institute: 412-827-0986  [facility]/ Big Sisters: [phone]([place])  [phone] ([place])  ACES through child's school: [phone]  [employer] Achievers: contact your local Y  SHIELD Mentor Program: [phone]

## 2018-02-21 NOTE — Progress Notes (Signed)
Subjective:  Patient ID: Rachael Smith, female    DOB: 08-Feb-1956  Age: 62 y.o. MRN: [id number]  CC:  BP check   HPI Rachael BarreBetty Kirkpatrickis a 62 y.o.femalewith a medical history of HTN presents for BP check. Last BP 159/97 mmHg nearly three weeks ago. BP 179/96 mmHg today. Says she has not yet taken her HCTZ and Amlodipine because she had rushed out of her house to get here this morning. Has been experiencing left sided and substernal chest pain with radiation to the back since one month ago. Pain is worse when physically active and when she is "stressed". Associated with SOB when physically active. Attributes stress to upcoming lay off in 30 days. Does not have any other job pending but is expecting to receive unemployment. PHQ9 14 and GAD7 14 today.     Outpatient Medications Prior to Visit  Medication Sig Dispense Refill  . amLODipine (NORVASC) 10 MG tablet Take 1 tablet (10 mg total) by mouth daily. 90 tablet 1  . atorvastatin (LIPITOR) 40 MG tablet Take 1 tablet (40 mg total) by mouth at bedtime. 90 tablet 1  . hydrochlorothiazide (HYDRODIURIL) 25 MG tablet Take 1 tablet (25 mg total) by mouth daily. Take on tablet in the morning. 90 tablet 1  . diphenhydrAMINE (BENADRYL) 25 MG tablet Take 1 tablet (25 mg total) by mouth every 6 (six) hours. 20 tablet 0   No facility-administered medications prior to visit.      ROS Review of Systems  Constitutional: Negative for chills, fever and malaise/fatigue.  Eyes: Negative for blurred vision.  Respiratory: Negative for shortness of breath.   Cardiovascular: Negative for chest pain and palpitations.  Gastrointestinal: Negative for abdominal pain and nausea.  Genitourinary: Negative for dysuria and hematuria.  Musculoskeletal: Negative for joint pain and myalgias.  Skin: Negative for rash.  Neurological: Negative for tingling and headaches.  Psychiatric/Behavioral: Negative for depression. The patient is not nervous/anxious.      Objective:  BP (!) 179/96 (BP Location: Left Arm, Patient Position: Sitting, Cuff Size: Large)   Pulse 67   Temp 97.9 F (36.6 C) (Oral)   Ht 5\' 2"  (1.575 m)   Wt 118 lb 9.6 oz (53.8 kg)   SpO2 95%   BMI 21.69 kg/m   BP/Weight 02/21/2018 01/31/2018 12/20/2017  Systolic BP 179 159 174  Diastolic BP 96 97 81  Wt. (Lbs) 118.6 117.2 118.6  BMI 21.69 21.44 21.69      Physical Exam  Constitutional: She is oriented to person, place, and time.  Well developed, well nourished, NAD, polite  HENT:  Head: Normocephalic and atraumatic.  Eyes: No scleral icterus.  Neck: Normal range of motion. Neck supple. No thyromegaly present.  Cardiovascular: Normal rate, regular rhythm and normal heart sounds.  Pulmonary/Chest: Effort normal and breath sounds normal.  Abdominal: Soft. Bowel sounds are normal. There is no tenderness.  Musculoskeletal: She exhibits no edema.  Neurological: She is alert and oriented to person, place, and time.  Skin: Skin is warm and dry. No rash noted. No erythema. No pallor.  Psychiatric: She has a normal mood and affect. Her behavior is normal. Thought content normal.  Vitals reviewed.    Assessment & Plan:    1. Essential hypertension - Administered cloNIDine (CATAPRES) tablet 0.1 mg - Begin aspirin EC 81 MG tablet; Take 1 tablet (81 mg total) by mouth daily.  Dispense: 30 tablet; Refill: 3  2. Other chest pain - EKG 12-Lead within normal limits today.  - Begin  nitroGLYCERIN (NITROSTAT) 0.4 MG SL tablet; Place 1 tablet (0.4 mg total) under the tongue every 5 (five) minutes as needed for chest pain.  Dispense: 25 tablet; Refill: 2  3. Depression with anxiety - Begin escitalopram (LEXAPRO) 10 MG tablet; Take 1 tablet (10 mg total) by mouth daily.  Dispense: 30 tablet; Refill: 2  4. Screening for breast cancer - MM DIGITAL SCREENING BILATERAL; Future   Meds ordered this encounter  Medications  . escitalopram (LEXAPRO) 10 MG tablet    Sig: Take 1  tablet (10 mg total) by mouth daily.    Dispense:  30 tablet    Refill:  2    Order Specific Question:   Supervising Provider    Answer:   Hoy Register [[id number]]  . cloNIDine (CATAPRES) tablet 0.1 mg  . aspirin EC 81 MG tablet    Sig: Take 1 tablet (81 mg total) by mouth daily.    Dispense:  30 tablet    Refill:  3    Order Specific Question:   Supervising Provider    Answer:   Hoy Register [[id number]]  . nitroGLYCERIN (NITROSTAT) 0.4 MG SL tablet    Sig: Place 1 tablet (0.4 mg total) under the tongue every 5 (five) minutes as needed for chest pain.    Dispense:  25 tablet    Refill:  2    Order Specific Question:   Supervising Provider    Answer:   Hoy Register [[id number]]    Follow-up: Return in about 4 weeks (around 03/21/2018).   Loletta Specter PA

## 2018-02-22 ENCOUNTER — Telehealth (INDEPENDENT_AMBULATORY_CARE_PROVIDER_SITE_OTHER): Payer: Self-pay

## 2018-02-22 LAB — COMPREHENSIVE METABOLIC PANEL
ALT: 10 IU/L (ref 0–32)
AST: 14 IU/L (ref 0–40)
Albumin/Globulin Ratio: 1.6 (ref 1.2–2.2)
Albumin: 4.5 g/dL (ref 3.6–4.8)
Alkaline Phosphatase: 102 IU/L (ref 39–117)
BUN/Creatinine Ratio: 15 (ref 12–28)
BUN: 9 mg/dL (ref 8–27)
Bilirubin Total: 0.3 mg/dL (ref 0.0–1.2)
CO2: 23 mmol/L (ref 20–29)
Calcium: 9.6 mg/dL (ref 8.7–10.3)
Chloride: 102 mmol/L (ref 96–106)
Creatinine, Ser: 0.61 mg/dL (ref 0.57–1.00)
GFR calc non Af Amer: 97 mL/min/{1.73_m2} (ref >59)
GFR, EST AFRICAN AMERICAN: 112 mL/min/{1.73_m2} (ref >59)
Globulin, Total: 2.8 g/dL (ref 1.5–4.5)
Glucose: 97 mg/dL (ref 65–99)
Potassium: 3.9 mmol/L (ref 3.5–5.2)
Sodium: 143 mmol/L (ref 134–144)
Total Protein: 7.3 g/dL (ref 6.0–8.5)

## 2018-02-22 LAB — CBC WITH DIFFERENTIAL/PLATELET
Basophils Absolute: 0 10*3/uL (ref 0.0–0.2)
Basos: 1 %
EOS (ABSOLUTE): 0.4 10*3/uL (ref 0.0–0.4)
EOS: 9 %
HEMOGLOBIN: 11.9 g/dL (ref 11.1–15.9)
Hematocrit: 35.4 % (ref 34.0–46.6)
IMMATURE GRANS (ABS): 0 10*3/uL (ref 0.0–0.1)
Immature Granulocytes: 0 %
Lymphocytes Absolute: 2.2 10*3/uL (ref 0.7–3.1)
Lymphs: 51 %
MCH: 28.2 pg (ref 26.6–33.0)
MCHC: 33.6 g/dL (ref 31.5–35.7)
MCV: 84 fL (ref 79–97)
MONOCYTES: 12 %
Monocytes Absolute: 0.5 10*3/uL (ref 0.1–0.9)
Neutrophils Absolute: 1.2 10*3/uL — ABNORMAL LOW (ref 1.4–7.0)
Neutrophils: 27 %
Platelets: 361 10*3/uL (ref 150–450)
RBC: 4.22 x10E6/uL (ref 3.77–5.28)
RDW: 12.4 % (ref 12.3–15.4)
WBC: 4.4 10*3/uL (ref 3.4–10.8)

## 2018-02-22 LAB — SEDIMENTATION RATE: Sed Rate: 26 mm/hr (ref 0–40)

## 2018-02-22 LAB — C-REACTIVE PROTEIN: CRP: 2 mg/L (ref 0–10)

## 2018-02-22 LAB — LIPASE: Lipase: 17 U/L (ref 14–72)

## 2018-02-22 NOTE — Telephone Encounter (Signed)
Left voicemail notifying patient that all labs are normal. Cherae Marton Budd PalmerS Kenlie Seki, CMA

## 2018-02-22 NOTE — Telephone Encounter (Signed)
-----   Message from Loletta Specteroger David Gomez, PA-C sent at 02/22/2018 11:29 AM EST ----- Normal labs.

## 2018-02-23 LAB — FECAL OCCULT BLOOD, IMMUNOCHEMICAL: Fecal Occult Bld: NEGATIVE

## 2018-02-24 ENCOUNTER — Telehealth (INDEPENDENT_AMBULATORY_CARE_PROVIDER_SITE_OTHER): Payer: Self-pay

## 2018-02-24 NOTE — Telephone Encounter (Signed)
Patient aware that FIT is negative. Tempestt S Roberts, CMA  

## 2018-02-24 NOTE — Telephone Encounter (Signed)
-----   Message from Loletta Specteroger David Gomez, PA-C sent at 02/23/2018 12:40 PM EST ----- FIT negative.

## 2018-02-25 ENCOUNTER — Telehealth: Payer: Self-pay | Admitting: Licensed Clinical Social Worker

## 2018-02-25 NOTE — Telephone Encounter (Signed)
LCSWA attempted to contact pt to follow up on behavioral health screens from prior appointment. LCSWA left message for a return call.  

## 2018-03-02 ENCOUNTER — Telehealth (INDEPENDENT_AMBULATORY_CARE_PROVIDER_SITE_OTHER): Payer: Self-pay | Admitting: Licensed Clinical Social Worker

## 2018-03-02 NOTE — Telephone Encounter (Signed)
Call placed to patient. LCSWA introduced self and [facility]. Pt was informed of a consult to address behavioral health and/or psychosocial needs.   Pt scheduled appointment with LCSWA for 03/08/18. No additional concerns noted.

## 2018-03-08 ENCOUNTER — Ambulatory Visit (INDEPENDENT_AMBULATORY_CARE_PROVIDER_SITE_OTHER): Payer: BLUE CROSS/BLUE SHIELD | Admitting: Licensed Clinical Social Worker

## 2018-03-08 DIAGNOSIS — F419 Anxiety disorder, unspecified: Secondary | ICD-10-CM

## 2018-03-08 DIAGNOSIS — F439 Reaction to severe stress, unspecified: Secondary | ICD-10-CM

## 2018-03-08 NOTE — BH Specialist Note (Signed)
Integrated Behavioral Health Initial Visit  MRN: [id number] Name: Rachael Smith  Number of Integrated Behavioral Health Clinician visits:: 1/6 Session Start time: 8:40 AM  Session End time: 9:10 AM Total time: 30 minutes  Type of Service: Integrated Behavioral Health- Individual/Family Interpretor:No. Interpretor Name and Language: N/A   SUBJECTIVE: Rachael MiresBetty Lipsett is a 62 y.o. female accompanied by self Patient was referred by Conley SimmondsPA-C Gomez for depression and anxiety. Patient reports the following symptoms/concerns: Pt reports difficulty managing stress. She grieves the loss of both parents and two brothers, who she cared for until their passing Duration of problem: Ongoing; Severity of problem: moderate  OBJECTIVE: Mood: Pleasant and Affect: Appropriate Risk of harm to self or others: No plan to harm self or others  LIFE CONTEXT: Family and Social: Pt receives strong support from family and friends School/Work: Pt is employed. She receives food stamps and has private insurance Self-Care: Pt enjoys spending time with her 40seven yo grandchild and sister [place], [place]) Life Changes: Pt has difficulty managing ongoing medical conditions and stress. She is grieving the loss of both parents and two brothers, who patient cared for until their passing.   GOALS ADDRESSED: Patient will: 1. Reduce symptoms of: anxiety, depression and stress 2. Increase knowledge and/or ability of: coping skills and healthy habits  3. Demonstrate ability to: Increase healthy adjustment to current life circumstances, Increase adequate support systems for patient/family and Begin healthy grieving over loss  INTERVENTIONS: Interventions utilized: Mindfulness or Management consultantelaxation Training, Supportive Counseling and Psychoeducation and/or Health Education  Standardized Assessments completed: GAD-7 and PHQ 2&9  ASSESSMENT: Patient currently experiencing depression and anxiety triggered by difficulty managing ongoing  medical conditions and stress. She is grieving the loss of both parents and two brothers, who patient cared for until their passing. Pt receives strong support from family and friends.   Patient may benefit from psychoeducation and psychotherapy. LCSWA educated pt on the correlation between one's physical and mental health, in addition, to how stress can negatively impact both. Therapeutic interventions were discussed to assist with decreasing and managing symptoms. Pt is currently participating in medication management through PCP.   PLAN: 1. Follow up with behavioral health clinician on : Pt was encouraged to contact LCSWA if symptoms worsen or fail to improve to [facility] 2. Behavioral recommendations: LCSWA recommends that pt apply healthy coping skills and comply with medication management 3. Referral(s[facility] 4. "From scale of 1-10, how likely are you to follow plan?":   Bridgett LarssonJasmine D Lynnelle Mesmer, LCSW 03/11/18 2:22 PM

## 2018-03-21 ENCOUNTER — Encounter (INDEPENDENT_AMBULATORY_CARE_PROVIDER_SITE_OTHER): Payer: Self-pay | Admitting: Physician Assistant

## 2018-03-21 ENCOUNTER — Other Ambulatory Visit: Payer: Self-pay

## 2018-03-21 ENCOUNTER — Ambulatory Visit (INDEPENDENT_AMBULATORY_CARE_PROVIDER_SITE_OTHER): Payer: BLUE CROSS/BLUE SHIELD | Admitting: Physician Assistant

## 2018-03-21 VITALS — BP 146/86 | HR 73 | Temp 97.3°F | Ht 62.0 in | Wt 114.6 lb

## 2018-03-21 DIAGNOSIS — F418 Other specified anxiety disorders: Secondary | ICD-10-CM

## 2018-03-21 DIAGNOSIS — I10 Essential (primary) hypertension: Secondary | ICD-10-CM | POA: Diagnosis not present

## 2018-03-21 MED ORDER — CARVEDILOL 6.25 MG PO TABS: 6.2500 mg | ORAL_TABLET | Freq: Two times a day (BID) | ORAL | 1 refills | Status: DC

## 2018-03-21 NOTE — Patient Instructions (Addendum)
Make sure your aspirin is enteric coated.    DASH Eating Plan DASH stands for "Dietary Approaches to Stop Hypertension." The DASH eating plan is a healthy eating plan that has been shown to reduce high blood pressure (hypertension). It may also reduce your risk for type 2 diabetes, heart disease, and stroke. The DASH eating plan may also help with weight loss. What are tips for following this plan?  General guidelines  Avoid eating more than 2,300 mg (milligrams) of salt (sodium) a day. If you have hypertension, you may need to reduce your sodium intake to 1,500 mg a day.  Limit alcohol intake to no more than 1 drink a day for nonpregnant women and 2 drinks a day for men. One drink equals 12 oz of beer, 5 oz of wine, or 1 oz of hard liquor.  Work with your health care provider to maintain a healthy body weight or to lose weight. Ask what an ideal weight is for you.  Get at least 30 minutes of exercise that causes your heart to beat faster (aerobic exercise) most days of the week. Activities may include walking, swimming, or biking.  Work with your health care provider or diet and nutrition specialist (dietitian) to adjust your eating plan to your individual calorie needs. Reading food labels   Check food labels for the amount of sodium per serving. Choose foods with less than 5 percent of the Daily Value of sodium. Generally, foods with less than 300 mg of sodium per serving fit into this eating plan.  To find whole grains, look for the word "whole" as the first word in the ingredient list. Shopping  Buy products labeled as "low-sodium" or "no salt added."  Buy fresh foods. Avoid canned foods and premade or frozen meals. Cooking  Avoid adding salt when cooking. Use salt-free seasonings or herbs instead of table salt or sea salt. Check with your health care provider or pharmacist before using salt substitutes.  Do not fry foods. Cook foods using healthy methods such as baking,  boiling, grilling, and broiling instead.  Cook with heart-healthy oils, such as olive, canola, soybean, or sunflower oil. Meal planning  Eat a balanced diet that includes: ? 5 or more servings of fruits and vegetables each day. At each meal, try to fill half of your plate with fruits and vegetables. ? Up to 6-8 servings of whole grains each day. ? Less than 6 oz of lean meat, poultry, or fish each day. A 3-oz serving of meat is about the same size as a deck of cards. One egg equals 1 oz. ? 2 servings of low-fat dairy each day. ? A serving of nuts, seeds, or beans 5 times each week. ? Heart-healthy fats. Healthy fats called Omega-3 fatty acids are found in foods such as flaxseeds and coldwater fish, like sardines, salmon, and mackerel.  Limit how much you eat of the following: ? Canned or prepackaged foods. ? Food that is high in trans fat, such as fried foods. ? Food that is high in saturated fat, such as fatty meat. ? Sweets, desserts, sugary drinks, and other foods with added sugar. ? Full-fat dairy products.  Do not salt foods before eating.  Try to eat at least 2 vegetarian meals each week.  Eat more home-cooked food and less restaurant, buffet, and fast food.  When eating at a restaurant, ask that your food be prepared with less salt or no salt, if possible. What foods are recommended? The items listed may  not be a complete list. Talk with your dietitian about what dietary choices are best for you. Grains Whole-grain or whole-wheat bread. Whole-grain or whole-wheat pasta. Brown rice. [place]ena Morrow. Bulgur. Whole-grain and low-sodium cereals. Pita bread. Low-fat, low-sodium crackers. Whole-wheat flour tortillas. Vegetables Fresh or frozen vegetables (raw, steamed, roasted, or grilled). Low-sodium or reduced-sodium tomato and vegetable juice. Low-sodium or reduced-sodium tomato sauce and tomato paste. Low-sodium or reduced-sodium canned vegetables. Fruits All fresh, dried, or  frozen fruit. Canned fruit in natural juice (without added sugar). Meat and other protein foods Skinless chicken or [place]. Ground chicken or [place]. Pork with fat trimmed off. Fish and seafood. Egg whites. Dried beans, peas, or lentils. Unsalted nuts, nut butters, and seeds. Unsalted canned beans. Lean cuts of beef with fat trimmed off. Low-sodium, lean deli meat. Dairy Low-fat (1%) or fat-free (skim) milk. Fat-free, low-fat, or reduced-fat cheeses. Nonfat, low-sodium ricotta or cottage cheese. Low-fat or nonfat yogurt. Low-fat, low-sodium cheese. Fats and oils Soft margarine without trans fats. Vegetable oil. Low-fat, reduced-fat, or light mayonnaise and salad dressings (reduced-sodium). Canola, safflower, olive, soybean, and sunflower oils. Avocado. Seasoning and other foods Herbs. Spices. Seasoning mixes without salt. Unsalted popcorn and pretzels. Fat-free sweets. What foods are not recommended? The items listed may not be a complete list. Talk with your dietitian about what dietary choices are best for you. Grains Baked goods made with fat, such as croissants, muffins, or some breads. Dry pasta or rice meal packs. Vegetables Creamed or fried vegetables. Vegetables in a cheese sauce. Regular canned vegetables (not low-sodium or reduced-sodium). Regular canned tomato sauce and paste (not low-sodium or reduced-sodium). Regular tomato and vegetable juice (not low-sodium or reduced-sodium). Angie Fava. Olives. Fruits Canned fruit in a light or heavy syrup. Fried fruit. Fruit in cream or butter sauce. Meat and other protein foods Fatty cuts of meat. Ribs. Fried meat. Berniece Salines. Sausage. Bologna and other processed lunch meats. Salami. Fatback. Hotdogs. Bratwurst. Salted nuts and seeds. Canned beans with added salt. Canned or smoked fish. Whole eggs or egg yolks. Chicken or [place] with skin. Dairy Whole or 2% milk, cream, and half-and-half. Whole or full-fat cream cheese. Whole-fat or sweetened yogurt.  Full-fat cheese. Nondairy creamers. Whipped toppings. Processed cheese and cheese spreads. Fats and oils Butter. Stick margarine. Lard. Shortening. Ghee. Bacon fat. Tropical oils, such as coconut, palm kernel, or palm oil. Seasoning and other foods Salted popcorn and pretzels. Onion salt, garlic salt, seasoned salt, table salt, and sea salt. Worcestershire sauce. Tartar sauce. Barbecue sauce. Teriyaki sauce. Soy sauce, including reduced-sodium. Steak sauce. Canned and packaged gravies. Fish sauce. Oyster sauce. Cocktail sauce. Horseradish that you find on the shelf. Ketchup. Mustard. Meat flavorings and tenderizers. Bouillon cubes. Hot sauce and Tabasco sauce. Premade or packaged marinades. Premade or packaged taco seasonings. Relishes. Regular salad dressings. Where to find more information:  National Heart, Lung, and [place]: https://wilson-eaton.com/  American Heart Association: www.heart.org Summary  The DASH eating plan is a healthy eating plan that has been shown to reduce high blood pressure (hypertension). It may also reduce your risk for type 2 diabetes, heart disease, and stroke.  With the DASH eating plan, you should limit salt (sodium) intake to 2,300 mg a day. If you have hypertension, you may need to reduce your sodium intake to 1,500 mg a day.  When on the DASH eating plan, aim to eat more fresh fruits and vegetables, whole grains, lean proteins, low-fat dairy, and heart-healthy fats.  Work with your health care provider or diet and  nutrition specialist (dietitian) to adjust your eating plan to your individual calorie needs. This information is not intended to replace advice given to you by your health care provider. Make sure you discuss any questions you have with your health care provider. Document Released: 02/26/2011 Document Revised: 03/02/2016 Document Reviewed: 03/02/2016 Elsevier Interactive Patient Education  2019 [employer].

## 2018-03-21 NOTE — Progress Notes (Signed)
Subjective:  Patient ID: Rachael MiresBetty Smith, female    DOB: Jul 29, 1955  Age: 62 y.o. MRN: [id number]  CC: f/u HTN   HPI [name] a 62y.o.femalewith a medical history of HTN presents for BP check. Last BP 159/97 mmHg nearly three weeks ago. BP 179/96 mmHg 28 days ago. Had not taken her antihypertensives at the time due to rushing to her appointment here. She was advised to take medications as directed and is currently doing so. BP 1476/86 mmHg today which is reportedly close to what she is seeing at home with her BP monitor. States she is feeling better and has not had chest pain since her last visit here. Also states her stress level has decreased since taking some time off for herself and since taking Lexapro. Only complaint is of some epigastric discomfort and nausea with Lexapro or Aspirin, not sure which. Does not endorse any other symptoms or complaints.      Outpatient Medications Prior to Visit  Medication Sig Dispense Refill  . amLODipine (NORVASC) 10 MG tablet Take 1 tablet (10 mg total) by mouth daily. 90 tablet 1  . aspirin EC 81 MG tablet Take 1 tablet (81 mg total) by mouth daily. 30 tablet 3  . atorvastatin (LIPITOR) 40 MG tablet Take 1 tablet (40 mg total) by mouth at bedtime. 90 tablet 1  . escitalopram (LEXAPRO) 10 MG tablet Take 1 tablet (10 mg total) by mouth daily. 30 tablet 2  . hydrochlorothiazide (HYDRODIURIL) 25 MG tablet Take 1 tablet (25 mg total) by mouth daily. Take on tablet in the morning. 90 tablet 1  . diphenhydrAMINE (BENADRYL) 25 MG tablet Take 1 tablet (25 mg total) by mouth every 6 (six) hours. 20 tablet 0  . nitroGLYCERIN (NITROSTAT) 0.4 MG SL tablet Place 1 tablet (0.4 mg total) under the tongue every 5 (five) minutes as needed for chest pain. (Patient not taking: Reported on 03/21/2018) 25 tablet 2   No facility-administered medications prior to visit.      ROS Review of Systems  Constitutional: Negative for chills, fever and  malaise/fatigue.  Eyes: Negative for blurred vision.  Respiratory: Negative for shortness of breath.   Cardiovascular: Negative for chest pain and palpitations.  Gastrointestinal: Positive for abdominal pain (epigasttric discomfort) and nausea.  Genitourinary: Negative for dysuria and hematuria.  Musculoskeletal: Negative for joint pain and myalgias.  Skin: Negative for rash.  Neurological: Negative for tingling and headaches.  Psychiatric/Behavioral: Negative for depression. The patient is not nervous/anxious.     Objective:  BP (!) 147/87 (BP Location: Left Arm, Patient Position: Sitting, Cuff Size: Small)   Pulse 74   Temp (!) 97.3 F (36.3 C) (Oral)   Ht 5\' 2"  (1.575 m)   Wt 114 lb 9.6 oz (52 kg)   SpO2 95%   BMI 20.96 kg/m   BP/Weight 03/21/2018 02/21/2018 01/31/2018  Systolic BP 147 159 159  Diastolic BP 87 99 97  Wt. (Lbs) 114.6 118.6 117.2  BMI 20.96 21.69 21.44      Physical Exam Vitals signs reviewed.  Constitutional:      Comments: Well developed, well nourished, NAD, polite  HENT:     Head: Normocephalic and atraumatic.  Eyes:     General: No scleral icterus. Neck:     Musculoskeletal: Normal range of motion and neck supple.     Thyroid: No thyromegaly.  Cardiovascular:     Rate and Rhythm: Normal rate and regular rhythm.     Heart sounds: Normal heart sounds.  Pulmonary:     Effort: Pulmonary effort is normal.     Breath sounds: Normal breath sounds.  Abdominal:     General: Bowel sounds are normal.     Palpations: Abdomen is soft.     Tenderness: There is no abdominal tenderness.  Skin:    General: Skin is warm and dry.     Coloration: Skin is not pale.     Findings: No erythema or rash.  Neurological:     Mental Status: She is alert and oriented to person, place, and time.  Psychiatric:        Behavior: Behavior normal.        Thought Content: Thought content normal.      Assessment & Plan:   1. Essential hypertension - Begin  carvedilol (COREG) 6.25 MG tablet; Take 1 tablet (6.25 mg total) by mouth 2 (two) times daily with a meal.  Dispense: 180 tablet; Refill: 1 -  2. Depression with anxiety - Continue Lexapro until completion. I have advised to cut tablet in half when there is two weeks left so that she may down titrate and discontinue. Social factors have improved and patient will not likely need long term SSRI.    Meds ordered this encounter  Medications  . carvedilol (COREG) 6.25 MG tablet    Sig: Take 1 tablet (6.25 mg total) by mouth 2 (two) times daily with a meal.    Dispense:  180 tablet    Refill:  1    Order Specific Question:   Supervising Provider    Answer:   Hoy RegisterNEWLIN, ENOBONG [[id number]]    Follow-up: Return in about 6 weeks (around 05/02/2018) for Nurse visit BP.   Loletta Specteroger David Annisten [place] PA

## 2018-03-24 ENCOUNTER — Other Ambulatory Visit (INDEPENDENT_AMBULATORY_CARE_PROVIDER_SITE_OTHER): Payer: Self-pay | Admitting: Physician Assistant

## 2018-03-24 DIAGNOSIS — I10 Essential (primary) hypertension: Secondary | ICD-10-CM

## 2018-03-24 NOTE — Telephone Encounter (Signed)
FWD to PCP. Nesha Counihan S Odilon Cass, CMA  

## 2018-04-12 ENCOUNTER — Emergency Department (HOSPITAL_COMMUNITY): Payer: BLUE CROSS/BLUE SHIELD

## 2018-04-12 ENCOUNTER — Emergency Department (HOSPITAL_COMMUNITY)
Admission: EM | Admit: 2018-04-12 | Discharge: 2018-04-12 | Disposition: A | Discharge status: Home / Self Care | Payer: BLUE CROSS/BLUE SHIELD | Attending: Emergency Medicine | Admitting: Emergency Medicine

## 2018-04-12 ENCOUNTER — Encounter (HOSPITAL_COMMUNITY): Payer: Self-pay | Admitting: Emergency Medicine

## 2018-04-12 ENCOUNTER — Other Ambulatory Visit: Payer: Self-pay

## 2018-04-12 DIAGNOSIS — Z7982 Long term (current) use of aspirin: Secondary | ICD-10-CM | POA: Insufficient documentation

## 2018-04-12 DIAGNOSIS — Z79899 Other long term (current) drug therapy: Secondary | ICD-10-CM | POA: Insufficient documentation

## 2018-04-12 DIAGNOSIS — R079 Chest pain, unspecified: Secondary | ICD-10-CM

## 2018-04-12 DIAGNOSIS — I10 Essential (primary) hypertension: Secondary | ICD-10-CM | POA: Diagnosis not present

## 2018-04-12 DIAGNOSIS — R0789 Other chest pain: Secondary | ICD-10-CM | POA: Diagnosis not present

## 2018-04-12 LAB — I-STAT TROPONIN, ED
TROPONIN I, POC: 0 ng/mL (ref 0.00–0.08)
Troponin i, poc: 0 ng/mL (ref 0.00–0.08)

## 2018-04-12 LAB — CBC
HCT: 37.3 % (ref 36.0–46.0)
HEMOGLOBIN: 11.9 g/dL — AB (ref 12.0–15.0)
MCH: 28.3 pg (ref 26.0–34.0)
MCHC: 31.9 g/dL (ref 30.0–36.0)
MCV: 88.8 fL (ref 80.0–100.0)
Platelets: 326 10*3/uL (ref 150–400)
RBC: 4.2 MIL/uL (ref 3.87–5.11)
RDW: 13.4 % (ref 11.5–15.5)
WBC: 6.5 10*3/uL (ref 4.0–10.5)
nRBC: 0 % (ref 0.0–0.2)

## 2018-04-12 LAB — BASIC METABOLIC PANEL
Anion gap: 10 (ref 5–15)
BUN: 13 mg/dL (ref 8–23)
CO2: 22 mmol/L (ref 22–32)
Calcium: 9.7 mg/dL (ref 8.9–10.3)
Chloride: 106 mmol/L (ref 98–111)
Creatinine, Ser: 0.77 mg/dL (ref 0.44–1.00)
GFR calc Af Amer: >60 mL/min (ref >60)
Glucose, Bld: 105 mg/dL — ABNORMAL HIGH (ref 70–99)
Potassium: 3.9 mmol/L (ref 3.5–5.1)
Sodium: 138 mmol/L (ref 135–145)

## 2018-04-12 MED ORDER — SODIUM CHLORIDE 0.9% FLUSH: 3.0000 mL | Freq: Once | INTRAVENOUS | Status: DC

## 2018-04-12 MED ORDER — TRAMADOL HCL 50 MG PO TABS
50.0000 mg | ORAL_TABLET | Freq: Once | ORAL | Status: AC
  Administered 2018-04-12: 50 mg via ORAL
  Filled 2018-04-12: qty 1

## 2018-04-12 MED ORDER — TRAMADOL HCL 50 MG PO TABS: 50.0000 mg | ORAL_TABLET | Freq: Four times a day (QID) | ORAL | 0 refills | Status: DC | PRN

## 2018-04-12 NOTE — ED Triage Notes (Signed)
Pt reports that she was putting up boxes Saturday and today she started having chest pain mainly on the right side, worse with movement and palpations. Pt reports taking 2 nitro and 324 ASA without relief prior to arrival without relief.

## 2018-04-12 NOTE — ED Notes (Signed)
Patient Alert and oriented to baseline. Stable and ambulatory to baseline. Patient verbalized understanding of the discharge instructions.  Patient belongings were taken by the patient.   

## 2018-04-12 NOTE — Discharge Instructions (Signed)
You were seen in the emergency department for right-sided chest pain.  I think this is more muscular in nature and we had blood work chest x-ray and EKG that did not show any signs of injury.  We are sending you home with some medication for pain if Tylenol does not work.  Please follow-up with your doctor and return if any worsening symptoms.

## 2018-04-12 NOTE — ED Provider Notes (Signed)
[facility] EMERGENCY DEPARTMENT Provider Note   CSN: [id number] Arrival date & time: 04/12/18  1539     History   Chief Complaint Chief Complaint  Patient presents with  . Chest Pain    HPI Rachael Smith is a 62 y.o. female.  Prior history of hypertension.  She is coming in here today after complaining of some right-sided chest pain that is been going on and off since Saturday.  She said she was moving some boxes then but does not recall actually getting injured.  The pain seems to occur mostly with any twisting or turning or moving of her trunk or arm.  It is not associate with any diaphoresis or shortness of breath.  She saw her primary care doctor for it and he gave her a prescription and nitro and aspirin.  She tried a nitro today when it happened and they gave her a headache and made her pain worse.  The history is provided by the patient.  Chest Pain  Pain location:  R chest Pain quality: sharp and shooting   Pain radiates to:  L shoulder Pain severity:  Moderate Onset quality:  Sudden Timing:  Intermittent Progression:  Unchanged Chronicity:  New Context: lifting, movement and raising an arm   Worsened by:  Certain positions and movement Ineffective treatments:  Aspirin and nitroglycerin Associated symptoms: no abdominal pain, no cough, no diaphoresis, no fever, no headache, no lower extremity edema, no nausea, no shortness of breath, no syncope and no vomiting     Past Medical History:  Diagnosis Date  . Hypertension     Patient Active Problem List   Diagnosis Date Noted  . Elevated lipoprotein(a) 02/17/2017    Past Surgical History:  Procedure Laterality Date  . ABDOMINAL HYSTERECTOMY       OB History   No obstetric history on file.      Home Medications    Prior to Admission medications   Medication Sig Start Date End Date Taking? Authorizing Provider  amLODipine (NORVASC) 10 MG tablet Take 1 tablet (10 mg total) by mouth  daily. 12/20/17   Loletta Specter, PA-C  aspirin EC 81 MG tablet Take 1 tablet (81 mg total) by mouth daily. 02/21/18   Loletta Specter, PA-C  atorvastatin (LIPITOR) 40 MG tablet Take 1 tablet (40 mg total) by mouth at bedtime. 12/20/17   Loletta Specter, PA-C  carvedilol (COREG) 6.25 MG tablet Take 1 tablet (6.25 mg total) by mouth 2 (two) times daily with a meal. 03/21/18   Loletta Specter, PA-C  diphenhydrAMINE (BENADRYL) 25 MG tablet Take 1 tablet (25 mg total) by mouth every 6 (six) hours. 10/18/11 07/24/16  Powers, Jules Husbands, MD  escitalopram (LEXAPRO) 10 MG tablet Take 1 tablet (10 mg total) by mouth daily. 02/21/18   Loletta Specter, PA-C  hydrochlorothiazide (HYDRODIURIL) 25 MG tablet Take 1 tablet (25 mg total) by mouth daily. Take on tablet in the morning. 12/20/17   Loletta Specter, PA-C  nitroGLYCERIN (NITROSTAT) 0.4 MG SL tablet Place 1 tablet (0.4 mg total) under the tongue every 5 (five) minutes as needed for chest pain. Patient not taking: Reported on 03/21/2018 02/21/18   Loletta Specter, PA-C    Family History No family history on file.  Social History Social History   Tobacco Use  . Smoking status: Never Smoker  . Smokeless tobacco: Never Used  Substance Use Topics  . Alcohol use: No  . Drug use: No  Allergies   Ibuprofen and Losartan   Review of Systems Review of Systems  Constitutional: Negative for diaphoresis and fever.  HENT: Negative for sore throat.   Eyes: Negative for visual disturbance.  Respiratory: Negative for cough and shortness of breath.   Cardiovascular: Positive for chest pain. Negative for syncope.  Gastrointestinal: Negative for abdominal pain, nausea and vomiting.  Genitourinary: Negative for dysuria.  Musculoskeletal: Negative for neck pain.  Skin: Negative for rash.  Neurological: Negative for headaches.     Physical Exam Updated Vital Signs BP (!) 156/86 (BP Location: Left Arm)   Pulse 77   Temp 98 F (36.7 C)  (Oral)   Resp 14   SpO2 98%   Physical Exam Vitals signs and nursing note reviewed.  Constitutional:      General: She is not in acute distress.    Appearance: She is well-developed.  HENT:     Head: Normocephalic and atraumatic.  Eyes:     Conjunctiva/sclera: Conjunctivae normal.  Neck:     Musculoskeletal: Neck supple.  Cardiovascular:     Rate and Rhythm: Normal rate and regular rhythm.     Heart sounds: No murmur.  Pulmonary:     Effort: Pulmonary effort is normal. No respiratory distress.     Breath sounds: Normal breath sounds.  Chest:     Chest wall: Tenderness present.    Abdominal:     Palpations: Abdomen is soft.     Tenderness: There is no abdominal tenderness.  Musculoskeletal:        General: No tenderness.     Right lower leg: She exhibits no tenderness. No edema.     Left lower leg: She exhibits no tenderness. No edema.  Skin:    General: Skin is warm and dry.     Capillary Refill: Capillary refill takes less than 2 seconds.  Neurological:     General: No focal deficit present.     Mental Status: She is alert.     Sensory: No sensory deficit.     Motor: No weakness.      ED Treatments / Results  Labs (all labs ordered are listed, but only abnormal results are displayed) Labs Reviewed  BASIC METABOLIC PANEL - Abnormal; Notable for the following components:      Result Value   Glucose, Bld 105 (*)    All other components within normal limits  CBC - Abnormal; Notable for the following components:   Hemoglobin 11.9 (*)    All other components within normal limits  I-STAT TROPONIN, ED  I-STAT TROPONIN, ED    EKG EKG Interpretation  Date/Time:  Tuesday April 12 2018 15:50:46 EST Ventricular Rate:  71 PR Interval:  162 QRS Duration: 74 QT Interval:  376 QTC Calculation: 408 R Axis:   10 Text Interpretation:  Normal sinus rhythm Normal ECG similar to prior 7/18 Confirmed by Meridee ScoreButler, Shahara Hartsfield [id number]) on 04/12/2018 6:42:15 PM   Radiology Dg  Chest 2 View  Result Date: 04/12/2018 CLINICAL DATA:  Right-sided chest pain, worse with movement. EXAM: CHEST - 2 VIEW COMPARISON:  01/05/2013 FINDINGS: Cardiomediastinal silhouette is normal. Mediastinal contours appear intact. Tortuosity of the aorta. There is no evidence of focal airspace consolidation, pleural effusion or pneumothorax. Osseous structures are without acute abnormality. Soft tissues are grossly normal. IMPRESSION: No active cardiopulmonary disease. Tortuosity of the aorta. Electronically Signed   By: Ted Mcalpineobrinka  Dimitrova M.D.   On: 04/12/2018 17:03    Procedures Procedures (including critical care time)  Medications Ordered  in ED Medications  sodium chloride flush (NS) 0.9 % injection 3 mL (has no administration in time range)  traMADol (ULTRAM) tablet 50 mg (has no administration in time range)     Initial Impression / Assessment and Plan / ED Course  I have reviewed the triage vital signs and the nursing notes.  Pertinent labs & imaging results that were available during my care of the patient were reviewed by me and considered in my medical decision making (see chart for details).  Clinical Course as of Apr 13 1250  Tue Apr 12, 2018  2034 Patient's delta troponin is negative for any signs of myocardial injury.  She got a tramadol here and says her pain is improved.  I think her is well she can be discharged and follow-up with her primary care doctor.   [MB]    Clinical Course User Index [MB] Terrilee Files, MD     Final Clinical Impressions(s) / ED Diagnoses   Final diagnoses:  Nonspecific chest pain    ED Discharge Orders         Ordered    traMADol (ULTRAM) 50 MG tablet  Every 6 hours PRN     04/12/18 2036           Terrilee Files, MD 04/13/18 1252

## 2018-04-19 ENCOUNTER — Encounter: Payer: Self-pay | Admitting: Cardiology

## 2018-04-19 ENCOUNTER — Ambulatory Visit (INDEPENDENT_AMBULATORY_CARE_PROVIDER_SITE_OTHER): Payer: BLUE CROSS/BLUE SHIELD | Admitting: Cardiology

## 2018-04-19 VITALS — BP 154/88 | HR 64 | Ht 62.0 in | Wt 115.2 lb

## 2018-04-19 DIAGNOSIS — E7800 Pure hypercholesterolemia, unspecified: Secondary | ICD-10-CM

## 2018-04-19 DIAGNOSIS — R079 Chest pain, unspecified: Secondary | ICD-10-CM

## 2018-04-19 DIAGNOSIS — Z7189 Other specified counseling: Secondary | ICD-10-CM

## 2018-04-19 DIAGNOSIS — I10 Essential (primary) hypertension: Secondary | ICD-10-CM | POA: Diagnosis not present

## 2018-04-19 MED ORDER — LOSARTAN POTASSIUM 25 MG PO TABS: 25.0000 mg | ORAL_TABLET | Freq: Every day | ORAL | 6 refills | Status: DC

## 2018-04-19 NOTE — Progress Notes (Signed)
Cardiology Office Note:    Date:  04/19/2018   ID:  Rachael Smith, DOB September 03, 1955, MRN [id number]  PCP:  Loletta Specter, PA-C  Cardiologist:  Jodelle Red, MD PhD  Referring MD: Loletta Specter, PA-C   CC: chest pain  History of Present Illness:    Rachael Smith is a 63 y.o. female with a hx of hypertension who is seen as a new consult at the request of Loletta Specter, PA-C for the evaluation and management of chest pain. She was seen in the ER for this on 04/12/18.  Chest pain: -Initial onset: always thought it was from being active at work, difficult for her to say how long it has been occurring but at least a few months -Quality: a pain/catch across her right shoulder and into her bilateral back. Pain starts out as sharp, then a squeezing sensation -Frequency: when she was working, noticed it every other day or so when she got home. Since being off work, has only had one episode -Duration: 15-20 mins -Associated symptoms: no lightheadedness, nausea, diaphoresis. Does get very short of breath with it and with exertion. -Aggravating/alleviating factors: Better when she takes the tramadol -Prior cardiac history: none that she knows of  -Prior ECG: normal sinus rhythm -Prior workup: negative troponins in the ER -Prior treatment: none -Alcohol: none -Tobacco: none -Comorbidities: hypertension: running 150s/80s at home. Recently started carvedilol -Exercise level: very active at work, was doing Saturday exercise classes -Cardiac ROS: no shortness of breath, no PND, no orthopnea, no LE edema, no syncope -Family history: father had MI, not sure of age. No other MI or CVA, no other heart disease  Past Medical History:  Diagnosis Date  . Hypertension     Past Surgical History:  Procedure Laterality Date  . ABDOMINAL HYSTERECTOMY      Current Medications: Current Outpatient Medications on File Prior to Visit  Medication Sig  . amLODipine (NORVASC) 10  MG tablet Take 1 tablet (10 mg total) by mouth daily.  [name] aspirin EC 81 MG tablet Take 1 tablet (81 mg total) by mouth daily.  [name] atorvastatin (LIPITOR) 40 MG tablet Take 1 tablet (40 mg total) by mouth at bedtime.  . carvedilol (COREG) 6.25 MG tablet Take 1 tablet (6.25 mg total) by mouth 2 (two) times daily with a meal.  . diphenhydrAMINE (BENADRYL) 25 MG tablet Take 1 tablet (25 mg total) by mouth every 6 (six) hours.  [name] escitalopram (LEXAPRO) 10 MG tablet Take 1 tablet (10 mg total) by mouth daily.  . hydrochlorothiazide (HYDRODIURIL) 25 MG tablet Take 1 tablet (25 mg total) by mouth daily. Take on tablet in the morning.  . nitroGLYCERIN (NITROSTAT) 0.4 MG SL tablet Place 1 tablet (0.4 mg total) under the tongue every 5 (five) minutes as needed for chest pain. (Patient not taking: Reported on 03/21/2018)  . traMADol (ULTRAM) 50 MG tablet Take 1 tablet (50 mg total) by mouth every 6 (six) hours as needed for severe pain.   No current facility-administered medications on file prior to visit.      Allergies:   Ibuprofen and Losartan   Social History   Socioeconomic History  . Marital status: Single    Spouse name: Not on file  . Number of children: Not on file  . Years of education: Not on file  . Highest education level: Not on file  Occupational History  . Not on file  Social Needs  . Financial resource strain: Not on file  .  Food insecurity:    Worry: Not on file    Inability: Not on file  . Transportation needs:    Medical: Not on file    Non-medical: Not on file  Tobacco Use  . Smoking status: Never Smoker  . Smokeless tobacco: Never Used  Substance and Sexual Activity  . Alcohol use: No  . Drug use: No  . Sexual activity: Not on file  Lifestyle  . Physical activity:    Days per week: Not on file    Minutes per session: Not on file  . Stress: Not on file  Relationships  . Social connections:    Talks on phone: Not on file    Gets together: Not on file    Attends  religious service: Not on file    Active member of club or organization: Not on file    Attends meetings of clubs or organizations: Not on file    Relationship status: Not on file  Other Topics Concern  . Not on file  Social History Narrative  . Not on file     Family History: father had MI, not sure of age. No other MI or CVA, no other heart disease  ROS:   Please see the history of present illness.  Additional pertinent ROS:  Constitutional: Negative for chills, fever, night sweats, unintentional weight loss  HENT: Negative for ear pain and hearing loss.   Eyes: Negative for loss of vision and eye pain.  Respiratory: Negative for cough, sputum, shortness of breath, wheezing.   Cardiovascular: As per HPI.  Gastrointestinal: Negative for abdominal pain, melena, and hematochezia.  Genitourinary: Negative for dysuria and hematuria.  Musculoskeletal: Negative for falls and myalgias.  Skin: Negative for itching and rash.  Neurological: Negative for focal weakness, focal sensory changes and loss of consciousness.  Endo/Heme/Allergies: Does not bruise/bleed easily.    EKGs/Labs/Other Studies Reviewed:    The following studies were reviewed today: No prior cardiac studies  EKG:  EKG is personally reviewed.  The ekg ordered today demonstrates normal sinus rhythm  Recent Labs: 02/21/2018: ALT 10 04/12/2018: BUN 13; Creatinine, Ser 0.77; Hemoglobin 11.9; Platelets 326; Potassium 3.9; Sodium 138  Recent Lipid Panel    Component Value Date/Time   CHOL 234 (H) 02/16/2017 1441   TRIG 130 02/16/2017 1441   HDL 55 02/16/2017 1441   CHOLHDL 4.3 02/16/2017 1441   LDLCALC 153 (H) 02/16/2017 1441    Physical Exam:    VS:  BP (!) 154/88 (BP Location: Right Arm, Patient Position: Sitting, Cuff Size: Normal)   Pulse 64   Ht 5\' 2"  (1.575 m)   Wt 115 lb 3.2 oz (52.3 kg)   BMI 21.07 kg/m     Wt Readings from Last 3 Encounters:  04/19/18 115 lb 3.2 oz (52.3 kg)  03/21/18 114 lb 9.6 oz  (52 kg)  02/21/18 118 lb 9.6 oz (53.8 kg)     GEN: Well nourished, well developed in no acute distress HEENT: Normal NECK: No JVD; No carotid bruits LYMPHATICS: No lymphadenopathy CARDIAC: regular rhythm, normal S1 and S2, no rubs, gallops. 1/6 holosystolic murmur. Radial and DP pulses 2+ bilaterally. RESPIRATORY:  Clear to auscultation without rales, wheezing or rhonchi  ABDOMEN: Soft, non-tender, non-distended MUSCULOSKELETAL:  No edema; No deformity  SKIN: Warm and dry NEUROLOGIC:  Alert and oriented x 3 PSYCHIATRIC:  Normal affect   ASSESSMENT:    1. Chest pain, unspecified type   2. Essential hypertension   3. Counseling on health promotion  and disease prevention   4. Pure hypercholesterolemia    PLAN:    1. Chest pain: has risk factors, including family history, hypertension, and hyperlipidemia. Normal ECG.  -will pursue exercise treadmill test  2. Hypertension: elevated today, asymptomatic.   -start losartan 25 mg daily  -check BMET 1 week after start  -will have her see PharmD in 1 month for titration, then me in 2 mos  3. Primary prevention -recommend heart healthy/Mediterranean diet, with whole grains, fruits, vegetable, fish, lean meats, nuts, and olive oil. Limit salt. -recommend moderate walking, 3-5 times/week for 30-50 minutes each session. Aim for at least 150 minutes.week. Goal should be pace of 3 miles/hours, or walking 1.5 miles in 30 minutes -recommend avoidance of tobacco products. Avoid excess alcohol. -Additional risk factor control:  -Diabetes: A1c is not available  -Lipids: hypercholesterolemia. last LDL elevated at 153. Had been on statin previously, not   -Blood pressure control: as above  -Weight: BMI 21 -ASCVD risk score: The 10-year ASCVD risk score Denman George DC Montez Hageman., et al., 2013) is: 14.9%   Values used to calculate the score:     Age: 85 years     Sex: Female     Is Non-Hispanic African American: Yes     Diabetic: No     Tobacco smoker:  No     Systolic Blood Pressure: 154 mmHg     Is BP treated: Yes     HDL Cholesterol: 55 mg/dL     Total Cholesterol: 234 mg/dL   Plan for follow up: 2 mos with me, 1 mos with PharmD clinic. At follow up, check lipids and discuss statin if hypertension well controlled.  Medication Adjustments/Labs and Tests Ordered: Current medicines are reviewed at length with the patient today.  Concerns regarding medicines are outlined above.  Orders Placed This Encounter  Procedures  . Basic metabolic panel  . EXERCISE TOLERANCE TEST (ETT)  . EKG 12-Lead   Meds ordered this encounter  Medications  . losartan (COZAAR) 25 MG tablet    Sig: Take 1 tablet (25 mg total) by mouth daily.    Dispense:  30 tablet    Refill:  6    Patient Instructions  Medication Instructions:  Start: Losartan 25 mg daily If you need a refill on your cardiac medications before your next appointment, please call your pharmacy.   Lab work: Your physician recommends that you return for lab work day of Stress test (BMP)  If you have labs (blood work) drawn today and your tests are completely normal, you will receive your results only by: [name] MyChart Message (if you have MyChart) OR . A paper copy in the mail If you have any lab test that is abnormal or we need to change your treatment, we will call you to review the results.  Testing/Procedures: Your physician has requested that you have an exercise tolerance test. For further information please visit [email or web address] Please also follow instruction sheet, as given. [address] Hold Coreg morning of test.   Follow-Up: At [facility], you and your health needs are our priority.  As part of our continuing mission to provide you with exceptional heart care, we have created designated Provider Care Teams.  These Care Teams include your primary Cardiologist (physician) and Advanced Practice Providers (APPs -  Physician Assistants and Nurse  Practitioners) who all work together to provide you with the care you need, when you need it. You will need a follow up appointment in 2  months.  Please call our office 2 months in advance to schedule this appointment.  You may see Dr. Cristal Deer or one of the following Advanced Practice Providers on your designated Care Team:   Theodore Demark, PA-C . Joni Reining, DNP, ANP  Your physician recommends that you schedule a follow-up appointment in 1 month with Hypertension Clinic.        Signed, Jodelle Red, MD PhD 04/19/2018 7:50 AM    [facility]

## 2018-04-19 NOTE — Patient Instructions (Signed)
Medication Instructions:  Start: Losartan 25 mg daily If you need a refill on your cardiac medications before your next appointment, please call your pharmacy.   Lab work: Your physician recommends that you return for lab work day of Stress test (BMP)  If you have labs (blood work) drawn today and your tests are completely normal, you will receive your results only by: [name] MyChart Message (if you have MyChart) OR . A paper copy in the mail If you have any lab test that is abnormal or we need to change your treatment, we will call you to review the results.  Testing/Procedures: Your physician has requested that you have an exercise tolerance test. For further information please visit [email or web address] Please also follow instruction sheet, as given. [address] Hold Coreg morning of test.   Follow-Up: At [facility], you and your health needs are our priority.  As part of our continuing mission to provide you with exceptional heart care, we have created designated Provider Care Teams.  These Care Teams include your primary Cardiologist (physician) and Advanced Practice Providers (APPs -  Physician Assistants and Nurse Practitioners) who all work together to provide you with the care you need, when you need it. You will need a follow up appointment in 2 months.  Please call our office 2 months in advance to schedule this appointment.  You may see Dr. Cristal Deer or one of the following Advanced Practice Providers on your designated Care Team:   Theodore Demark, PA-C . Joni Reining, DNP, ANP  Your physician recommends that you schedule a follow-up appointment in 1 month with Hypertension Clinic.

## 2018-04-25 ENCOUNTER — Encounter: Payer: Self-pay | Admitting: Cardiology

## 2018-05-05 ENCOUNTER — Telehealth (HOSPITAL_COMMUNITY): Payer: Self-pay

## 2018-05-05 NOTE — Telephone Encounter (Signed)
Encounter complete. 

## 2018-05-09 ENCOUNTER — Ambulatory Visit (INDEPENDENT_AMBULATORY_CARE_PROVIDER_SITE_OTHER): Payer: BLUE CROSS/BLUE SHIELD

## 2018-05-09 VITALS — BP 137/83 | HR 72 | Temp 98.6°F

## 2018-05-09 DIAGNOSIS — Z0130 Encounter for examination of blood pressure without abnormal findings: Secondary | ICD-10-CM

## 2018-05-10 ENCOUNTER — Ambulatory Visit (HOSPITAL_COMMUNITY)
Admission: RE | Admit: 2018-05-10 | Discharge: 2018-05-10 | Disposition: A | Discharge status: Home / Self Care | Payer: BLUE CROSS/BLUE SHIELD | Source: Ambulatory Visit | Attending: Cardiovascular Disease | Admitting: Cardiovascular Disease

## 2018-05-10 DIAGNOSIS — I10 Essential (primary) hypertension: Secondary | ICD-10-CM | POA: Diagnosis not present

## 2018-05-10 DIAGNOSIS — R079 Chest pain, unspecified: Secondary | ICD-10-CM | POA: Diagnosis not present

## 2018-05-11 LAB — EXERCISE TOLERANCE TEST
Estimated workload: 8.5 METS
Exercise duration (min): 7 min
Exercise duration (sec): 1 s
MPHR: 157 {beats}/min
Peak HR: 142 {beats}/min
Percent HR: 90 %
RPE: 19
Rest HR: 65 {beats}/min

## 2018-05-11 LAB — BASIC METABOLIC PANEL
BUN/Creatinine Ratio: 10 — ABNORMAL LOW (ref 12–28)
BUN: 7 mg/dL — ABNORMAL LOW (ref 8–27)
CO2: 21 mmol/L (ref 20–29)
CREATININE: 0.73 mg/dL (ref 0.57–1.00)
Calcium: 10 mg/dL (ref 8.7–10.3)
Chloride: 100 mmol/L (ref 96–106)
GFR calc Af Amer: 101 mL/min/{1.73_m2} (ref >59)
GFR calc non Af Amer: 88 mL/min/{1.73_m2} (ref >59)
Glucose: 91 mg/dL (ref 65–99)
Potassium: 4.4 mmol/L (ref 3.5–5.2)
Sodium: 141 mmol/L (ref 134–144)

## 2018-05-20 ENCOUNTER — Ambulatory Visit (INDEPENDENT_AMBULATORY_CARE_PROVIDER_SITE_OTHER): Payer: BLUE CROSS/BLUE SHIELD | Admitting: Pharmacist

## 2018-05-20 VITALS — BP 132/84 | HR 68 | Ht 63.0 in | Wt 117.4 lb

## 2018-05-20 DIAGNOSIS — I10 Essential (primary) hypertension: Secondary | ICD-10-CM | POA: Diagnosis not present

## 2018-05-20 NOTE — Progress Notes (Signed)
Patient ID: Rachael Smith                 DOB: Oct 31, 1955                      MRN: [id number]     HPI: Rachael Smith is a 63 y.o. female referred by Dr. Cristal Deer to HTN clinic. PMH includes chest pain, hypertension, and hypercholesterolemia.  Denies dizzines, swelling, headaches, blurry vision, or shortness of breath. She reports compliance with medication, and denies problems with therapy. I asked about previous reaction to losartan , but patient denies previous "swelling" or angioedema to losartan.   Current HTN meds:  Amlodipine 10mg  daily Carvedilol 6.25mg  twice daily HCTZ 25mg  daily Losartan 25mg  daily  Previously tried:  Losartan - angioedema in the past (questionable??)  BP goal: 130/80  Family History:   Social History:   Diet: About 50/50 eats out vs home cooked meals  Exercise: activities of daily living, lots of walking and standing at work  Home BP readings: no records provided. Per patients report 130s - 140s  Wt Readings from Last 3 Encounters:  05/20/18 117 lb 6.4 oz (53.3 kg)  04/19/18 115 lb 3.2 oz (52.3 kg)  03/21/18 114 lb 9.6 oz (52 kg)   BP Readings from Last 3 Encounters:  05/20/18 132/84  05/09/18 137/83  04/19/18 (!) 154/88   Pulse Readings from Last 3 Encounters:  05/20/18 68  05/09/18 72  04/19/18 64    Renal function: Estimated Creatinine Clearance: 59.5 mL/min (by C-G formula based on SCr of 0.73 mg/dL).  Past Medical History:  Diagnosis Date  . Hypertension     Current Outpatient Medications on File Prior to Visit  Medication Sig Dispense Refill  . amLODipine (NORVASC) 10 MG tablet Take 1 tablet (10 mg total) by mouth daily. 90 tablet 1  . aspirin EC 81 MG tablet Take 1 tablet (81 mg total) by mouth daily. 30 tablet 3  . carvedilol (COREG) 6.25 MG tablet Take 1 tablet (6.25 mg total) by mouth 2 (two) times daily with a meal. 180 tablet 1  . diphenhydrAMINE (BENADRYL) 25 MG tablet Take 1 tablet (25 mg total) by mouth every  6 (six) hours. (Patient taking differently: Take 25 mg by mouth every 6 (six) hours as needed. ) 20 tablet 0  . escitalopram (LEXAPRO) 10 MG tablet Take 1 tablet (10 mg total) by mouth daily. 30 tablet 2  . hydrochlorothiazide (HYDRODIURIL) 25 MG tablet Take 1 tablet (25 mg total) by mouth daily. Take on tablet in the morning. 90 tablet 1  . losartan (COZAAR) 25 MG tablet Take 1 tablet (25 mg total) by mouth daily. 30 tablet 6  . nitroGLYCERIN (NITROSTAT) 0.4 MG SL tablet Place 1 tablet (0.4 mg total) under the tongue every 5 (five) minutes as needed for chest pain. 25 tablet 2  . traMADol (ULTRAM) 50 MG tablet Take 1 tablet (50 mg total) by mouth every 6 (six) hours as needed for severe pain. 12 tablet 0   No current facility-administered medications on file prior to visit.     Allergies  Allergen Reactions  . Ibuprofen Swelling    Blood pressure 132/84, pulse 68, height 5\' 3"  (1.6 m), weight 117 lb 6.4 oz (53.3 kg), SpO2 99 %.  Essential hypertension Blood pressure is significantly improved today. Patient is a little bit anxious during the OV today ,she has to go to work in the minutes. No ADR noted with therapy at this  time.  Will avoid increase losartan dose at this time due to questionable angioedema in the past. Will follow up in 4 weeks with Dr Cristal Deer.    Zyler Hyson Rodriguez-Guzman PharmD, BCPS, CPP [facility] [address] [place] [address] 05/30/2018 8:18 AM

## 2018-05-20 NOTE — Patient Instructions (Signed)
Return for a follow up appointment in 4 weeks with Dr Cristal Deer  Check your blood pressure at home daily (if able) and keep record of the readings.  Take your BP meds as follows: *No medication changes*  Bring all of your meds, your BP cuff and your record of home blood pressures to your next appointment.  Exercise as you're able, try to walk approximately 30 minutes per day.  Keep salt intake to a minimum, especially watch canned and prepared boxed foods.  Eat more fresh fruits and vegetables and fewer canned items.  Avoid eating in fast food restaurants.    HOW TO TAKE YOUR BLOOD PRESSURE: . Rest 5 minutes before taking your blood pressure. .  Don't smoke or drink caffeinated beverages for at least 30 minutes before. . Take your blood pressure before (not after) you eat. . Sit comfortably with your back supported and both feet on the floor (don't cross your legs). . Elevate your arm to heart level on a table or a desk. . Use the proper sized cuff. It should fit smoothly and snugly around your bare upper arm. There should be enough room to slip a fingertip under the cuff. The bottom edge of the cuff should be 1 inch above the crease of the elbow. . Ideally, take 3 measurements at one sitting and record the average.

## 2018-05-24 ENCOUNTER — Encounter: Payer: Self-pay | Admitting: Pharmacist

## 2018-05-24 DIAGNOSIS — I10 Essential (primary) hypertension: Secondary | ICD-10-CM | POA: Insufficient documentation

## 2018-05-24 NOTE — Assessment & Plan Note (Signed)
Blood pressure is significantly improved today. Patient is a little bit anxious during the OV today ,she has to go to work in the minutes. [name]  Will avoid increase losartan dose at this time due to questionable angioedema in the past. Will follow up in 4 weeks with Dr Cristal Deer.

## 2018-06-17 ENCOUNTER — Telehealth: Payer: Self-pay

## 2018-06-17 NOTE — Telephone Encounter (Signed)
TELEPHONE CALL NOTE  Rachael Smith has been deemed a candidate for a follow-up tele-health visit to limit community exposure during the Covid-19 pandemic. I spoke with the patient via phone to ensure availability of phone/video source, confirm preferred email & phone number, and discuss instructions and expectations.  I reminded Rachael Smith to be prepared with any vital sign and/or heart rhythm information that could potentially be obtained via home monitoring, at the time of her visit. I reminded Rachael Smith to expect a phone call at the time of her visit if her visit.  Did the patient verbally acknowledge consent to treatment? YES  Parke Poisson, RN 06/17/2018 3:50 PM   DOWNLOADING THE WEBEX SOFTWARE TO SMARTPHONE  - If Apple, go to [employer] and type in WebEx in the search bar. Download [employer], the blue/green circle. The app is free but as with any other app downloads, their phone may require them to verify saved payment information or Apple password. The patient does NOT have to create an account.  - If Android, ask patient to go to [employer] and type in WebEx in the search bar. Download [employer], the blue/green circle. The app is free but as with any other app downloads, their phone may require them to verify saved payment information or Android password. The patient does NOT have to create an account.   CONSENT FOR TELE-HEALTH VISIT - PLEASE REVIEW  I hereby voluntarily request, consent and authorize [facility] and its employed or contracted physicians, physician assistants, nurse practitioners or other licensed health care professionals (the Practitioner), to provide me with telemedicine health care services (the "Services") as deemed necessary by the treating Practitioner. I acknowledge and consent to receive the Services by the Practitioner via telemedicine. I understand that the telemedicine visit will involve communicating with the  Practitioner through live audiovisual communication technology and the disclosure of certain medical information by electronic transmission. I acknowledge that I have been given the opportunity to request an in-person assessment or other available alternative prior to the telemedicine visit and am voluntarily participating in the telemedicine visit.  I understand that I have the right to withhold or withdraw my consent to the use of telemedicine in the course of my care at any time, without affecting my right to future care or treatment, and that the Practitioner or I may terminate the telemedicine visit at any time. I understand that I have the right to inspect all information obtained and/or recorded in the course of the telemedicine visit and may receive copies of available information for a reasonable fee.  I understand that some of the potential risks of receiving the Services via telemedicine include:  [name] Delay or interruption in medical evaluation due to technological equipment failure or disruption; . Information transmitted may not be sufficient (e.g. poor resolution of images) to allow for appropriate medical decision making by the Practitioner; and/or  . In rare instances, security protocols could fail, causing a breach of personal health information.  Furthermore, I acknowledge that it is my responsibility to provide information about my medical history, conditions and care that is complete and accurate to the best of my ability. I acknowledge that Practitioner's advice, recommendations, and/or decision may be based on factors not within their control, such as incomplete or inaccurate data provided by me or distortions of diagnostic images or specimens that may result from electronic transmissions. I understand that the practice of medicine is not an exact science and that  Practitioner makes no warranties or guarantees regarding treatment outcomes. I acknowledge that I will receive a copy of this  consent concurrently upon execution via email to the email address I last provided but may also request a printed copy by calling the office of [facility].    I understand that my insurance will be billed for this visit.   I have read or had this consent read to me. . I understand the contents of this consent, which adequately explains the benefits and risks of the Services being provided via telemedicine.  . I have been provided ample opportunity to ask questions regarding this consent and the Services and have had my questions answered to my satisfaction. . I give my informed consent for the services to be provided through the use of telemedicine in my medical care  By participating in this telemedicine visit I agree to the above.

## 2018-06-21 ENCOUNTER — Ambulatory Visit: Payer: BLUE CROSS/BLUE SHIELD | Admitting: Cardiology

## 2018-06-21 ENCOUNTER — Other Ambulatory Visit: Payer: Self-pay

## 2018-06-22 ENCOUNTER — Telehealth (INDEPENDENT_AMBULATORY_CARE_PROVIDER_SITE_OTHER): Payer: BLUE CROSS/BLUE SHIELD | Admitting: Cardiology

## 2018-06-22 ENCOUNTER — Encounter: Payer: Self-pay | Admitting: Cardiology

## 2018-06-22 VITALS — BP 141/82 | HR 65 | Ht 63.0 in | Wt 110.9 lb

## 2018-06-22 DIAGNOSIS — Z7189 Other specified counseling: Secondary | ICD-10-CM

## 2018-06-22 DIAGNOSIS — Z79899 Other long term (current) drug therapy: Secondary | ICD-10-CM

## 2018-06-22 DIAGNOSIS — I10 Essential (primary) hypertension: Secondary | ICD-10-CM | POA: Diagnosis not present

## 2018-06-22 NOTE — Patient Instructions (Signed)
Medication Instructions:  Your Physician recommend you continue on your current medication as directed.    If you need a refill on your cardiac medications before your next appointment, please call your pharmacy.   Lab work: Your physician recommends that you return for lab work in 4 months prior to scheduled appointment (lipids, CMP)  If you have labs (blood work) drawn today and your tests are completely normal, you will receive your results only by: [name] MyChart Message (if you have MyChart) OR . A paper copy in the mail If you have any lab test that is abnormal or we need to change your treatment, we will call you to review the results.  Testing/Procedures: None  Follow-Up: Your physician recommends that you schedule a follow-up appointment in 4 months

## 2018-06-22 NOTE — Progress Notes (Signed)
Virtual Visit via Telephone Note    Evaluation Performed:  Follow-up visit  This visit type was conducted due to national recommendations for restrictions regarding the COVID-19 Pandemic (e.g. social distancing).  This format is felt to be most appropriate for this patient at this time.  All issues noted in this document were discussed and addressed.  No physical exam was performed (except for noted visual exam findings with Video Visits).  Please refer to the patient's chart (MyChart message for video visits and phone note for telephone visits) for the patient's consent to telehealth for [facility].  Date:  06/22/2018   ID:  Rachael Smith, DOB December 28, 1955, MRN [id number]  Patient Location:  [address] [place], [place] [address]  Provider location:   Remote work, base is [place], [place] - [facility]  PCP:  Grayce Sessions, NP  Cardiologist:  Jodelle Red, MD   Chief Complaint:  Follow up of blood pressure  History of Present Illness:    Rachael Smith is a 63 y.o. female who presents via audio/video conferencing for a telehealth visit today.   The patient does not have symptoms concerning for COVID-19 infection (fever, chills, cough, or new shortness of breath).   See initial note from 04/19/18. Seen for chest pain, ETT negative. Adjusted medication given persistent hypertension. Seen in follow up HTN clinic 05/20/18.  No chest pain since last visit. Reviewed results again of ETT.   HTN: checks twice a day at home. Has been 135/69-143/82. No issues with medications.   Discussed her ASCVD risk based on her prior visit. While atorvastatin and pravastatin are mentioned in her prior med orders, she does not remember ever taking them. Reviewed her CV risk. We will update her labs and reassess her risk at follow up.  Denies chest pain, shortness of breath at rest or with normal exertion. No PND, orthopnea, LE edema or unexpected weight gain.  No syncope or palpitations.   Prior CV studies:   The following studies were reviewed today: ETT, records in chart  Past Medical History:  Diagnosis Date  . Hypertension    Past Surgical History:  Procedure Laterality Date  . ABDOMINAL HYSTERECTOMY       Current Meds  Medication Sig  . amLODipine (NORVASC) 10 MG tablet Take 1 tablet (10 mg total) by mouth daily.  [name] aspirin EC 81 MG tablet Take 1 tablet (81 mg total) by mouth daily.  . carvedilol (COREG) 6.25 MG tablet Take 1 tablet (6.25 mg total) by mouth 2 (two) times daily with a meal.  . escitalopram (LEXAPRO) 10 MG tablet Take 1 tablet (10 mg total) by mouth daily.  . hydrochlorothiazide (HYDRODIURIL) 25 MG tablet Take 1 tablet (25 mg total) by mouth daily. Take on tablet in the morning.  [name] losartan (COZAAR) 25 MG tablet Take 1 tablet (25 mg total) by mouth daily.  . nitroGLYCERIN (NITROSTAT) 0.4 MG SL tablet Place 1 tablet (0.4 mg total) under the tongue every 5 (five) minutes as needed for chest pain.     Allergies:   Ibuprofen   Social History   Tobacco Use  . Smoking status: Never Smoker  . Smokeless tobacco: Never Used  Substance Use Topics  . Alcohol use: No  . Drug use: No     Family Hx: father had MI, not sure of age. No other MI or CVA, no other heart issues  ROS:   Please see the history of present illness.  Specifically denies fevers, chills, infectious symptoms All other systems reviewed and are negative.   Labs/Other Tests and Data Reviewed:    Recent Labs: 02/21/2018: ALT 10 04/12/2018: Hemoglobin 11.9; Platelets 326 05/10/2018: BUN 7; Creatinine, Ser 0.73; Potassium 4.4; Sodium 141   Recent Lipid Panel Lab Results  Component Value Date/Time   CHOL 234 (H) 02/16/2017 02:41 PM   TRIG 130 02/16/2017 02:41 PM   HDL 55 02/16/2017 02:41 PM   CHOLHDL 4.3 02/16/2017 02:41 PM   LDLCALC 153 (H) 02/16/2017 02:41 PM    Wt Readings from Last 3 Encounters:  06/22/18 110 lb 14.4 oz (50.3 kg)   05/20/18 117 lb 6.4 oz (53.3 kg)  04/19/18 115 lb 3.2 oz (52.3 kg)     Objective:    Vital Signs:  BP (!) 141/82   Pulse 65   Ht 5\' 3"  (1.6 m)   Wt 110 lb 14.4 oz (50.3 kg)   BMI 19.65 kg/m    ASSESSMENT & PLAN:    1.  Hypertension: Improved, nearing goal of 130/80 -continue amlodipine 10 mg daily, carvedilol 6.25 mg BID, HCTZ 25 mg daily, and losartan 25 mg daily -will recheck labs prior to next visit  2. CV risk -prior ASCVD assessment was 14.9%. This was with elevated BP and older lipids. Will recheck lipids, CMP prior to next visit and reassess. Did discuss potential for statin use based on these results  COVID-19 Education: The signs and symptoms of COVID-19 were discussed with the patient and how to seek care for testing (follow up with PCP or arrange E-visit).  The importance of social distancing was discussed today.  Patient Risk:   After full review of this patient's clinical status, I feel that they are at least moderate risk at this time.  Time:   Today, I have spent 11 minutes with the patient with telehealth technology discussing hypertension management.    Patient Instructions  Medication Instructions:  Your Physician recommend you continue on your current medication as directed.    If you need a refill on your cardiac medications before your next appointment, please call your pharmacy.   Lab work: Your physician recommends that you return for lab work in 4 months prior to scheduled appointment (lipids, CMP)  If you have labs (blood work) drawn today and your tests are completely normal, you will receive your results only by: [name] MyChart Message (if you have MyChart) OR . A paper copy in the mail If you have any lab test that is abnormal or we need to change your treatment, we will call you to review the results.  Testing/Procedures: None  Follow-Up: Your physician recommends that you schedule a follow-up appointment in 4 months       Medication  Adjustments/Labs and Tests Ordered: Current medicines are reviewed at length with the patient today.  Concerns regarding medicines are outlined above.  Tests Ordered: Orders Placed This Encounter  Procedures  . Lipid panel  . Comprehensive metabolic panel   Medication Changes: No orders of the defined types were placed in this encounter.   Disposition:  Follow up in 4 month(s)  Signed, Jodelle Red, MD  06/22/2018 8:29 AM    [facility]

## 2018-06-23 ENCOUNTER — Telehealth: Payer: Self-pay | Admitting: Cardiology

## 2018-06-23 NOTE — Telephone Encounter (Signed)
Called patient to schedule 4 month followup with Dr. Cristal Deer, but, phone just rang and no VM.

## 2018-06-24 ENCOUNTER — Telehealth: Payer: Self-pay | Admitting: Cardiology

## 2018-06-24 NOTE — Telephone Encounter (Signed)
Called the patient and LVM to call back and schedule 4 month followup with Dr. Cristal Deer.

## 2018-09-20 ENCOUNTER — Telehealth: Payer: Self-pay

## 2018-09-20 ENCOUNTER — Telehealth: Payer: Self-pay | Admitting: Cardiology

## 2018-09-20 NOTE — Telephone Encounter (Signed)
LVM, reminding pt of her appt on 09-21-18 with Dr Harrell Gave.

## 2018-09-20 NOTE — Telephone Encounter (Signed)
  Spoke with who agreed to come in office to be seen. Pt made aware of visitor's policy and to wear a mask.  COVID-19 Pre-Screening Questions:  . In the past 7 to 10 days have you had a cough,  shortness of breath, headache, congestion, fever (100 or greater) body aches, chills, sore throat, or sudden loss of taste or sense of smell? No  . Have you been around anyone with known Covid 19. No . Have you been around anyone who is awaiting Covid 19 test results in the past 7 to 10 days? No . Have you been around anyone who has been exposed to Covid 19, or has mentioned symptoms of Covid 19 within the past 7 to 10 days? No  If you have any concerns/questions about symptoms patients report during screening (either on the phone or at threshold). Contact the provider seeing the patient or DOD for further guidance.  If neither are available contact a member of the leadership team.

## 2018-09-21 ENCOUNTER — Other Ambulatory Visit: Payer: Self-pay

## 2018-09-21 ENCOUNTER — Ambulatory Visit: Payer: BC Managed Care – PPO | Admitting: Cardiology

## 2018-09-21 ENCOUNTER — Encounter: Payer: Self-pay | Admitting: Cardiology

## 2018-09-21 VITALS — BP 128/72 | HR 74 | Ht 62.0 in | Wt 111.3 lb

## 2018-09-21 DIAGNOSIS — Z79899 Other long term (current) drug therapy: Secondary | ICD-10-CM

## 2018-09-21 DIAGNOSIS — Z7189 Other specified counseling: Secondary | ICD-10-CM | POA: Diagnosis not present

## 2018-09-21 DIAGNOSIS — I10 Essential (primary) hypertension: Secondary | ICD-10-CM

## 2018-09-21 LAB — COMPREHENSIVE METABOLIC PANEL
ALT: 7 IU/L (ref 0–32)
AST: 16 IU/L (ref 0–40)
Albumin/Globulin Ratio: 2 (ref 1.2–2.2)
Albumin: 4.8 g/dL (ref 3.8–4.8)
Alkaline Phosphatase: 90 IU/L (ref 39–117)
BUN/Creatinine Ratio: 20 (ref 12–28)
BUN: 13 mg/dL (ref 8–27)
Bilirubin Total: 0.3 mg/dL (ref 0.0–1.2)
CO2: 24 mmol/L (ref 20–29)
Calcium: 10.1 mg/dL (ref 8.7–10.3)
Chloride: 96 mmol/L (ref 96–106)
Creatinine, Ser: 0.66 mg/dL (ref 0.57–1.00)
GFR calc Af Amer: 109 mL/min/{1.73_m2} (ref >59)
GFR calc non Af Amer: 94 mL/min/{1.73_m2} (ref >59)
Globulin, Total: 2.4 g/dL (ref 1.5–4.5)
Glucose: 91 mg/dL (ref 65–99)
Potassium: 3.6 mmol/L (ref 3.5–5.2)
Sodium: 137 mmol/L (ref 134–144)
Total Protein: 7.2 g/dL (ref 6.0–8.5)

## 2018-09-21 LAB — LIPID PANEL
Chol/HDL Ratio: 3.9 ratio (ref 0.0–4.4)
Cholesterol, Total: 251 mg/dL — ABNORMAL HIGH (ref 100–199)
HDL: 65 mg/dL (ref >39)
LDL Calculated: 171 mg/dL — ABNORMAL HIGH (ref 0–99)
Triglycerides: 75 mg/dL (ref 0–149)
VLDL Cholesterol Cal: 15 mg/dL (ref 5–40)

## 2018-09-21 NOTE — Patient Instructions (Addendum)
Medication Instructions:  Your Physician recommend you continue on your current medication as directed.    If you need a refill on your cardiac medications before your next appointment, please call your pharmacy.   Lab work: Your physician recommends that you return for lab work today (lipid, LFT)  If you have labs (blood work) drawn today and your tests are completely normal, you will receive your results only by: [name] MyChart Message (if you have MyChart) OR . A paper copy in the mail If you have any lab test that is abnormal or we need to change your treatment, we will call you to review the results.  Testing/Procedures: None  Follow-Up: At [facility], you and your health needs are our priority.  As part of our continuing mission to provide you with exceptional heart care, we have created designated Provider Care Teams.  These Care Teams include your primary Cardiologist (physician) and Advanced Practice Providers (APPs -  Physician Assistants and Nurse Practitioners) who all work together to provide you with the care you need, when you need it. You will need a follow up appointment in 1 years.  Please call our office 2 months in advance to schedule this appointment.  You may see Buford Dresser, MD or one of the following Advanced Practice Providers on your designated Care Team:   Rosaria Ferries, PA-C . Jory Sims, DNP, ANP

## 2018-09-21 NOTE — Progress Notes (Signed)
Cardiology Office Note:    Date:  09/21/2018   ID:  Rachael Smith, DOB [birth year], MRN [id number]  PCP:  Rachael Perna, NP  Cardiologist:  Rachael Dresser, MD PhD  Referring MD: Rachael Perna, NP   CC: follow up  History of Present Illness:    Rachael Smith is a 63 y.o. female with a hx of hypertension who is seen in follow up. Initial consult note 04/19/18.   Cardiac history: seen initially for chest pain. ETT negative. Followed up for elevated BP in HTN clinic and with me 06/2018.   HTN: taking medicines daily. Forgot log today, thinks it has been good. No high numbers greater than 150-160, thinks it has been consistently lower than that.  CV risk: needs updated lipids today. Discussed primary prevention recommendations. Prior ASCVD risk 15%, reviewed that this is in the range recommended for statin. Discussed current guidelines re: statins and aspirin.  Avoiding social exposure. Walks in the afternoon, walks the track 4 laps (1 mile) and walks to/from school. Also pushes her grandchild's stroller around the block.  No more chest pain. Denies shortness of breath at rest or with normal exertion. No PND, orthopnea, LE edema or unexpected weight gain. No syncope or palpitations.  Past Medical History:  Diagnosis Date  . Hypertension     Past Surgical History:  Procedure Laterality Date  . ABDOMINAL HYSTERECTOMY      Current Medications: Current Outpatient Medications on File Prior to Visit  Medication Sig  . amLODipine (NORVASC) 10 MG tablet Take 1 tablet (10 mg total) by mouth daily.  [name] aspirin EC 81 MG tablet Take 1 tablet (81 mg total) by mouth daily.  . carvedilol (COREG) 6.25 MG tablet Take 1 tablet (6.25 mg total) by mouth 2 (two) times daily with a meal.  . escitalopram (LEXAPRO) 10 MG tablet Take 1 tablet (10 mg total) by mouth daily.  . hydrochlorothiazide (HYDRODIURIL) 25 MG tablet Take 1 tablet (25 mg total) by mouth daily. Take on tablet in  the morning.  [name] losartan (COZAAR) 25 MG tablet Take 1 tablet (25 mg total) by mouth daily.  . nitroGLYCERIN (NITROSTAT) 0.4 MG SL tablet Place 1 tablet (0.4 mg total) under the tongue every 5 (five) minutes as needed for chest pain.  . diphenhydrAMINE (BENADRYL) 25 MG tablet Take 1 tablet (25 mg total) by mouth every 6 (six) hours. (Patient taking differently: Take 25 mg by mouth every 6 (six) hours as needed. )   No current facility-administered medications on file prior to visit.      Allergies:   Ibuprofen   Social History   Socioeconomic History  . Marital status: Single    Spouse name: Not on file  . Number of children: Not on file  . Years of education: Not on file  . Highest education level: Not on file  Occupational History  . Not on file  Social Needs  . Financial resource strain: Not on file  . Food insecurity    Worry: Not on file    Inability: Not on file  . Transportation needs    Medical: Not on file    Non-medical: Not on file  Tobacco Use  . Smoking status: Never Smoker  . Smokeless tobacco: Never Used  Substance and Sexual Activity  . Alcohol use: No  . Drug use: No  . Sexual activity: Not on file  Lifestyle  . Physical activity    Days per week: Not on file  Minutes per session: Not on file  . Stress: Not on file  Relationships  . Social Musicianconnections    Talks on phone: Not on file    Gets together: Not on file    Attends religious service: Not on file    Active member of club or organization: Not on file    Attends meetings of clubs or organizations: Not on file    Relationship status: Not on file  Other Topics Concern  . Not on file  Social History Narrative  . Not on file     Family History: father had MI, not sure of age. No other MI or CVA, no other heart disease  ROS:   Please see the history of present illness.  Additional pertinent ROS: Constitutional: Negative for chills, fever, night sweats, unintentional weight loss  HENT:  Negative for ear pain and hearing loss.   Eyes: Negative for loss of vision and eye pain.  Respiratory: Negative for cough, sputum, wheezing.   Cardiovascular: See HPI. Gastrointestinal: Negative for abdominal pain, melena, and hematochezia.  Genitourinary: Negative for dysuria and hematuria.  Musculoskeletal: Negative for falls and myalgias.  Skin: Negative for itching and rash.  Neurological: Negative for focal weakness, focal sensory changes and loss of consciousness.  Endo/Heme/Allergies: Does not bruise/bleed easily.    EKGs/Labs/Other Studies Reviewed:    The following studies were reviewed today: ETT 05/11/18 normal  EKG:  EKG is personally reviewed.  The ekg ordered 04/19/18 demonstrates normal sinus rhythm  Recent Labs: 02/21/2018: ALT 10 04/12/2018: Hemoglobin 11.9; Platelets 326 05/10/2018: BUN 7; Creatinine, Ser 0.73; Potassium 4.4; Sodium 141  Recent Lipid Panel    Component Value Date/Time   CHOL 234 (H) 02/16/2017 1441   TRIG 130 02/16/2017 1441   HDL 55 02/16/2017 1441   CHOLHDL 4.3 02/16/2017 1441   LDLCALC 153 (H) 02/16/2017 1441    Physical Exam:    VS:  BP 128/72   Pulse 74   Ht 5\' 2"  (1.575 m)   Wt 111 lb 4.8 oz (50.5 kg)   SpO2 97%   BMI 20.36 kg/m     Wt Readings from Last 3 Encounters:  09/21/18 111 lb 4.8 oz (50.5 kg)  06/22/18 110 lb 14.4 oz (50.3 kg)  05/20/18 117 lb 6.4 oz (53.3 kg)    GEN: Well nourished, well developed in no acute distress HEENT: Normal NECK: No JVD; No carotid bruits LYMPHATICS: No lymphadenopathy CARDIAC: regular rhythm, normal S1 and S2, no murmurs, rubs, gallops. Radial and DP pulses 2+ bilaterally. RESPIRATORY:  Clear to auscultation without rales, wheezing or rhonchi  ABDOMEN: Soft, non-tender, non-distended MUSCULOSKELETAL:  No edema; No deformity  SKIN: Warm and dry NEUROLOGIC:  Alert and oriented x 3 PSYCHIATRIC:  Normal affect    ASSESSMENT:    1. Essential hypertension   2. Medication management   3.  Cardiac risk counseling   4. Counseling on health promotion and disease prevention    PLAN:    Hypertension: at goal today  -continue losartan 25 mg daily -continue amlodipine 10 mg daily -continue carvedilol 6.25 mg BID -continue HCTZ 25 mg daily  Primary prevention and CV risk counseling -recommend heart healthy/Mediterranean diet, with whole grains, fruits, vegetable, fish, lean meats, nuts, and olive oil. Limit salt. -recommend moderate walking, 3-5 times/week for 30-50 minutes each session. Aim for at least 150 minutes.week. Goal should be pace of 3 miles/hours, or walking 1.5 miles in 30 minutes -recommend avoidance of tobacco products. Avoid excess alcohol. -Additional risk  factor control:  -Diabetes: A1c is not available  -Lipids: hypercholesterolemia. last LDL elevated at 153 in 2018. Rechecking today.   -Blood pressure control: as above.   -Weight: BMI 20 -ASCVD risk score: The 10-year ASCVD risk score Denman George(Goff [place] Montez HagemanJr., et al., 2013) is: 9.4%   Values used to calculate the score:     Age: [age] years     Sex: Female     Is Non-Hispanic African American: Yes     Diabetic: No     Tobacco smoker: No     Systolic Blood Pressure: 128 mmHg     Is BP treated: Yes     HDL Cholesterol: 55 mg/dL     Total Cholesterol: 234 mg/dL  -currently on aspirin 81 mg daily. Discussed current guidelines. If her ASCVD risk is still elevated, would start low dose pravastatin. At that time could stop the aspirin. Discussed with her today and she is amenable  Plan for follow up: 1 year or sooner PRN  Medication Adjustments/Labs and Tests Ordered: Current medicines are reviewed at length with the patient today.  Concerns regarding medicines are outlined above.   Patient Instructions  Medication Instructions:  Your Physician recommend you continue on your current medication as directed.    If you need a refill on your cardiac medications before your next appointment, please call your pharmacy.    Lab work: Your physician recommends that you return for lab work today (lipid, LFT)  If you have labs (blood work) drawn today and your tests are completely normal, you will receive your results only by: [name]. MyChart Message (if you have MyChart) OR . A paper copy in the mail If you have any lab test that is abnormal or we need to change your treatment, we will call you to review the results.  Testing/Procedures: None  Follow-Up: At [facility], you and your health needs are our priority.  As part of our continuing mission to provide you with exceptional heart care, we have created designated Provider Care Teams.  These Care Teams include your primary Cardiologist (physician) and Advanced Practice Providers (APPs -  Physician Assistants and Nurse Practitioners) who all work together to provide you with the care you need, when you need it. You will need a follow up appointment in 1 years.  Please call our office 2 months in advance to schedule this appointment.  You may see Jodelle RedBridgette Roman Sandall, MD or one of the following Advanced Practice Providers on your designated Care Team:   Theodore DemarkRhonda Barrett, PA-C . Joni ReiningKathryn Lawrence, DNP, ANP         Signed, Jodelle RedBridgette Loye Vento, MD PhD 09/21/2018 8:50 AM    [facility]

## 2018-09-27 ENCOUNTER — Other Ambulatory Visit: Payer: Self-pay

## 2018-09-27 DIAGNOSIS — Z79899 Other long term (current) drug therapy: Secondary | ICD-10-CM

## 2018-09-27 MED ORDER — PRAVASTATIN SODIUM 20 MG PO TABS: 20.0000 mg | ORAL_TABLET | Freq: Every evening | ORAL | 11 refills | Status: DC

## 2018-10-04 ENCOUNTER — Other Ambulatory Visit: Payer: Self-pay | Admitting: Cardiology

## 2018-10-04 DIAGNOSIS — I10 Essential (primary) hypertension: Secondary | ICD-10-CM

## 2018-10-04 MED ORDER — CARVEDILOL 6.25 MG PO TABS: 6.2500 mg | ORAL_TABLET | Freq: Two times a day (BID) | ORAL | 3 refills | Status: DC

## 2019-01-13 ENCOUNTER — Other Ambulatory Visit: Payer: Self-pay

## 2019-01-13 ENCOUNTER — Ambulatory Visit (INDEPENDENT_AMBULATORY_CARE_PROVIDER_SITE_OTHER): Payer: BC Managed Care – PPO

## 2019-01-13 DIAGNOSIS — Z23 Encounter for immunization: Secondary | ICD-10-CM

## 2019-05-22 ENCOUNTER — Other Ambulatory Visit: Payer: Self-pay | Admitting: Cardiology

## 2019-05-22 DIAGNOSIS — I10 Essential (primary) hypertension: Secondary | ICD-10-CM

## 2019-08-10 ENCOUNTER — Other Ambulatory Visit: Payer: Self-pay | Admitting: Physician Assistant

## 2019-08-10 ENCOUNTER — Other Ambulatory Visit: Payer: Self-pay

## 2019-08-10 DIAGNOSIS — Z1231 Encounter for screening mammogram for malignant neoplasm of breast: Secondary | ICD-10-CM

## 2019-08-29 ENCOUNTER — Encounter (INDEPENDENT_AMBULATORY_CARE_PROVIDER_SITE_OTHER): Payer: Self-pay | Admitting: Primary Care

## 2019-08-29 ENCOUNTER — Other Ambulatory Visit: Payer: Self-pay

## 2019-08-29 ENCOUNTER — Ambulatory Visit (INDEPENDENT_AMBULATORY_CARE_PROVIDER_SITE_OTHER): Payer: BC Managed Care – PPO | Admitting: Primary Care

## 2019-08-29 VITALS — BP 161/90 | HR 60 | Temp 97.5°F | Ht 62.0 in | Wt 116.8 lb

## 2019-08-29 DIAGNOSIS — Z1211 Encounter for screening for malignant neoplasm of colon: Secondary | ICD-10-CM | POA: Diagnosis not present

## 2019-08-29 DIAGNOSIS — Z7689 Persons encountering health services in other specified circumstances: Secondary | ICD-10-CM | POA: Diagnosis not present

## 2019-08-29 DIAGNOSIS — I10 Essential (primary) hypertension: Secondary | ICD-10-CM

## 2019-08-29 DIAGNOSIS — R0789 Other chest pain: Secondary | ICD-10-CM

## 2019-08-29 DIAGNOSIS — E010 Iodine-deficiency related diffuse (endemic) goiter: Secondary | ICD-10-CM

## 2019-08-29 MED ORDER — HYDROCHLOROTHIAZIDE 25 MG PO TABS: 25.0000 mg | ORAL_TABLET | Freq: Every day | ORAL | 0 refills | Status: DC

## 2019-08-29 MED ORDER — NITROGLYCERIN 0.4 MG SL SUBL: 0.4000 mg | SUBLINGUAL_TABLET | SUBLINGUAL | 2 refills | Status: DC | PRN

## 2019-08-29 MED ORDER — AMLODIPINE BESYLATE 10 MG PO TABS: 10.0000 mg | ORAL_TABLET | Freq: Every day | ORAL | 1 refills | Status: DC

## 2019-08-29 MED ORDER — CLONIDINE HCL 0.1 MG PO TABS
0.2000 mg | ORAL_TABLET | Freq: Once | ORAL | Status: AC
  Administered 2019-08-29: 0.2 mg via ORAL

## 2019-08-29 MED ORDER — LOSARTAN POTASSIUM 25 MG PO TABS: 25.0000 mg | ORAL_TABLET | Freq: Every day | ORAL | 0 refills | Status: DC

## 2019-08-29 NOTE — Patient Instructions (Signed)

## 2019-08-29 NOTE — Progress Notes (Signed)
New Patient Office Visit  Subjective:  Patient ID: Rachael Smith, female    DOB: 03-Oct-1955  Age: 64 y.o. MRN: [id number]  CC:  Chief Complaint  Patient presents with  . Hypertension  . Medication Refill    HPI Rachael Smith presents for establishment of care and elevated blood pressure . She has been out of blood pressure medication for a while. Previously followed by cardiologist. She is having a follow up in July. Denies shortness of breath, headaches, chest pain or lower extremity edema  Past Medical History:  Diagnosis Date  . Hypertension     Past Surgical History:  Procedure Laterality Date  . ABDOMINAL HYSTERECTOMY      No family history on file.  Social History   Socioeconomic History  . Marital status: Single    Spouse name: Not on file  . Number of children: Not on file  . Years of education: Not on file  . Highest education level: Not on file  Occupational History  . Not on file  Tobacco Use  . Smoking status: Never Smoker  . Smokeless tobacco: Never Used  Substance and Sexual Activity  . Alcohol use: No  . Drug use: No  . Sexual activity: Not on file  Other Topics Concern  . Not on file  Social History Narrative  . Not on file   Social Determinants of Health   Financial Resource Strain:   . Difficulty of Paying Living Expenses:   Food Insecurity:   . Worried About Charity fundraiser in the Last Year:   . Arboriculturist in the Last Year:   Transportation Needs:   . Film/video editor (Medical):   [name] Lack of Transportation (Non-Medical):   Physical Activity:   . Days of Exercise per Week:   . Minutes of Exercise per Session:   Stress:   . Feeling of Stress :   Social Connections:   . Frequency of Communication with Friends and Family:   . Frequency of Social Gatherings with Friends and Family:   . Attends Religious Services:   . Active Member of Clubs or Organizations:   . Attends Archivist Meetings:   [name]  Marital Status:   Intimate Partner Violence:   . Fear of Current or Ex-Partner:   . Emotionally Abused:   [name] Physically Abused:   . Sexually Abused:     ROS Review of Systems  All other systems reviewed and are negative.   Objective:   Today's Vitals: BP (!) 210/110 (BP Location: Right Arm, Patient Position: Sitting, Cuff Size: Normal)   Pulse 64   Temp (!) 97.5 F (36.4 C) (Temporal)   Ht 5' 2" (1.575 m)   Wt 116 lb 12.8 oz (53 kg)   SpO2 98%   BMI 21.36 kg/m   Physical Exam  General: Vital signs reviewed.  Patient is well-developed and well-nourished, in no acute distress and cooperative with exam.  Head: Normocephalic and atraumatic. Eyes: EOMI, conjunctivae normal, no scleral icterus.  Neck: Supple, trachea midline, normal ROM, no JVD, masses, slight enlarge thyroid , or carotid bruit present.  Cardiovascular: RRR, S1 normal, S2 normal, no murmurs, gallops, or rubs. Pulmonary/Chest: Clear to auscultation bilaterally, no wheezes, rales, or rhonchi. Abdominal: Soft, non-tender, non-distended, BS +, no masses, organomegaly, or guarding present.  Musculoskeletal: No joint deformities, erythema, or stiffness, ROM full and nontender. Extremities: No lower extremity edema bilaterally,  pulses symmetric and intact bilaterally. No cyanosis or clubbing. Neurological: A&O  x3, Strength is normal and symmetric bilaterally, , no focal motor deficit, sensory intact to light touch bilaterally.  Skin: Warm, dry and intact. No rashes or erythema. Psychiatric: Normal mood and affect. speech and behavior is normal. Cognition and memory are normal.  Assessment & Plan:  Maysoon was seen today for hypertension and medication refill.  Diagnoses and all orders for this visit:  Essential hypertension Counseled on blood pressure goal of less than 130/80, low-sodium, DASH diet, medication compliance, 150 minutes of moderate intensity exercise per week. Discussed medication compliance, adverse  effects. -     cloNIDine (CATAPRES) tablet 0.2 mg given in office  -     losartan (COZAAR) 25 MG tablet; Take 1 tablet (25 mg total) by mouth daily. amLODipine (NORVASC) 10 MG tablet; Take 1 tablet (10 mg total) by mouth daily. -     CMP14+EGFR; Future  -     Other chest pain No chest pain at this visit discussed warning signs.  nitroGLYCERIN (NITROSTAT) 0.4 MG SL tablet; Place 1 tablet (0.4 mg total) under the tongue every 5 (five) minutes as needed for chest pain.  -   Encounter to establish care [name], NP-C will be your  (PCP) she is mastered prepared . She is skilled to diagnosed and treat illness. Also able to answer health concern as well as continuing care of varied medical conditions, not limited by cause, organ system, or diagnosis.  -     CBC with Differential/Platelet  Special screening for malignant neoplasms, colon Received a letter from Dr. Collene Mares it was time for colonoscopy . Patient will follow up and have procedure done at that office . This was where her previous was done.   Thyromegaly Right side  Check TSH/T4  Other orders -     hydrochlorothiazide (HYDRODIURIL) 25 MG tablet; Take 1 tablet (25 mg total) by mouth daily. Take on tablet in the morning.      Outpatient Encounter Medications as of 08/29/2019  Medication Sig  . aspirin EC 81 MG tablet Take 1 tablet (81 mg total) by mouth daily.  . carvedilol (COREG) 6.25 MG tablet TAKE 1 TABLET (6.25 MG TOTAL) BY MOUTH 2 (TWO) TIMES DAILY WITH A MEAL.  [name] amLODipine (NORVASC) 10 MG tablet Take 1 tablet (10 mg total) by mouth daily.  . diphenhydrAMINE (BENADRYL) 25 MG tablet Take 1 tablet (25 mg total) by mouth every 6 (six) hours. (Patient taking differently: Take 25 mg by mouth every 6 (six) hours as needed. )  . hydrochlorothiazide (HYDRODIURIL) 25 MG tablet Take 1 tablet (25 mg total) by mouth daily. Take on tablet in the morning.  [name] losartan (COZAAR) 25 MG tablet Take 1 tablet (25 mg total) by mouth daily.  .  nitroGLYCERIN (NITROSTAT) 0.4 MG SL tablet Place 1 tablet (0.4 mg total) under the tongue every 5 (five) minutes as needed for chest pain.  . pravastatin (PRAVACHOL) 20 MG tablet Take 1 tablet (20 mg total) by mouth every evening.  . [DISCONTINUED] amLODipine (NORVASC) 10 MG tablet Take 1 tablet (10 mg total) by mouth daily. (Patient not taking: Reported on 08/29/2019)  . [DISCONTINUED] escitalopram (LEXAPRO) 10 MG tablet Take 1 tablet (10 mg total) by mouth daily.  . [DISCONTINUED] hydrochlorothiazide (HYDRODIURIL) 25 MG tablet Take 1 tablet (25 mg total) by mouth daily. Take on tablet in the morning. (Patient not taking: Reported on 08/29/2019)  . [DISCONTINUED] losartan (COZAAR) 25 MG tablet Take 1 tablet (25 mg total) by mouth daily.  . [  DISCONTINUED] nitroGLYCERIN (NITROSTAT) 0.4 MG SL tablet Place 1 tablet (0.4 mg total) under the tongue every 5 (five) minutes as needed for chest pain.  . [EXPIRED] cloNIDine (CATAPRES) tablet 0.2 mg    No facility-administered encounter medications on file as of 08/29/2019.    Follow-up: Return for physical exam -pap.   Kerin Perna, NP

## 2019-09-19 ENCOUNTER — Ambulatory Visit (INDEPENDENT_AMBULATORY_CARE_PROVIDER_SITE_OTHER): Payer: BC Managed Care – PPO | Admitting: Primary Care

## 2019-09-19 ENCOUNTER — Other Ambulatory Visit (HOSPITAL_COMMUNITY)
Admission: RE | Admit: 2019-09-19 | Discharge: 2019-09-19 | Disposition: A | Discharge status: Home / Self Care | Payer: BC Managed Care – PPO | Source: Ambulatory Visit | Attending: Primary Care | Admitting: Primary Care

## 2019-09-19 ENCOUNTER — Encounter (INDEPENDENT_AMBULATORY_CARE_PROVIDER_SITE_OTHER): Payer: Self-pay | Admitting: Primary Care

## 2019-09-19 ENCOUNTER — Other Ambulatory Visit: Payer: Self-pay

## 2019-09-19 VITALS — BP 144/86 | HR 73 | Temp 97.3°F

## 2019-09-19 DIAGNOSIS — Z1239 Encounter for other screening for malignant neoplasm of breast: Secondary | ICD-10-CM | POA: Diagnosis not present

## 2019-09-19 DIAGNOSIS — Z113 Encounter for screening for infections with a predominantly sexual mode of transmission: Secondary | ICD-10-CM | POA: Insufficient documentation

## 2019-09-19 DIAGNOSIS — Z7689 Persons encountering health services in other specified circumstances: Secondary | ICD-10-CM | POA: Diagnosis not present

## 2019-09-19 DIAGNOSIS — Z124 Encounter for screening for malignant neoplasm of cervix: Secondary | ICD-10-CM

## 2019-09-19 DIAGNOSIS — I10 Essential (primary) hypertension: Secondary | ICD-10-CM

## 2019-09-19 DIAGNOSIS — E010 Iodine-deficiency related diffuse (endemic) goiter: Secondary | ICD-10-CM | POA: Diagnosis not present

## 2019-09-19 DIAGNOSIS — Z01419 Encounter for gynecological examination (general) (routine) without abnormal findings: Secondary | ICD-10-CM | POA: Diagnosis not present

## 2019-09-19 NOTE — Progress Notes (Signed)
Subjective:     Rachael Smith is a 64 y.o. female and is here for a comprehensive physical exam. The patient reports dry mouth.  Social History   Socioeconomic History  . Marital status: Single    Spouse name: Not on file  . Number of children: Not on file  . Years of education: Not on file  . Highest education level: Not on file  Occupational History  . Not on file  Tobacco Use  . Smoking status: Never Smoker  . Smokeless tobacco: Never Used  Vaping Use  . Vaping Use: Never used  Substance and Sexual Activity  . Alcohol use: No  . Drug use: No  . Sexual activity: Not on file  Other Topics Concern  . Not on file  Social History Narrative  . Not on file   Social Determinants of Health   Financial Resource Strain:   . Difficulty of Paying Living Expenses:   Food Insecurity:   . Worried About Charity fundraiser in the Last Year:   . Arboriculturist in the Last Year:   Transportation Needs:   . Film/video editor (Medical):   [name] Lack of Transportation (Non-Medical):   Physical Activity:   . Days of Exercise per Week:   . Minutes of Exercise per Session:   Stress:   . Feeling of Stress :   Social Connections:   . Frequency of Communication with Friends and Family:   . Frequency of Social Gatherings with Friends and Family:   . Attends Religious Services:   . Active Member of Clubs or Organizations:   . Attends Archivist Meetings:   [name] Marital Status:   Intimate Partner Violence:   . Fear of Current or Ex-Partner:   . Emotionally Abused:   [name] Physically Abused:   . Sexually Abused:    Health Maintenance  Topic Date Due  . PAP SMEAR-Modifier  Never done  . MAMMOGRAM  08/07/2016  . INFLUENZA VACCINE  10/22/2019  . Fecal DNA (Cologuard)  02/23/2021  . TETANUS/TDAP  12/01/2026  . Hepatitis C Screening  Completed  . HIV Screening  Completed   Review of Systems Pertinent items noted in HPI and remainder of comprehensive ROS otherwise negative.    Objective:  BP (!) 144/86   Pulse 73   Temp (!) 97.3 F (36.3 C)    Assessment:  CONSTITUTIONAL: Well-developed, well-nourished female thin frame in no acute distress.  HENT:  Normocephalic, atraumatic, External right and left ear normal. Oropharynx is clear and moist EYES: Conjunctivae and EOM are normal. Pupils are equal, round, and reactive to light. No scleral icterus.  NECK: Normal range of motion, supple, no masses.  Normal thyroid.  SKIN: Skin is warm and dry. No rash noted. Not diaphoretic. No erythema. No pallor. [place]: Alert and oriented to person, place, and time. Normal reflexes, muscle tone coordination. No cranial nerve deficit noted. PSYCHIATRIC: Normal mood and affect. Normal behavior. Normal judgment and thought content. CARDIOVASCULAR: Normal heart rate noted, regular rhythm RESPIRATORY: Clear to auscultation bilaterally. Effort and breath sounds normal, no problems with respiration noted. BREASTS: Symmetric in size. No masses, skin changes, nipple drainage, or lymphadenopathy. ABDOMEN: Soft, normal bowel sounds, no distention noted.  No tenderness, rebound or guarding.  PELVIC: Normal appearing external genitalia; normal appearing vaginal mucosa and cervix.  No abnormal discharge noted.  Pap smear obtained.  Normal uterine size, no other palpable masses, no uterine or adnexal tenderness. MUSCULOSKELETAL: Normal range of motion.  No tenderness.  No cyanosis, clubbing, or edema.  2+ distal pulses.  Plan:  Yailin was seen today for annual exam and gynecologic exam.  Diagnoses and all orders for this visit:  Cervical cancer screening -     Cytology - PAP([facility])  Screening examination for sexually transmitted disease -     Cervicovaginal ancillary only  Breast screening -     MM DIGITAL SCREENING BILATERAL; Future  Encounter for gynecological examination without abnormal finding Completed   Encounter to establish care -     CBC with  Differential/Platelet  Thyromegaly -     TSH + free T4  Essential hypertension Following up with cardiology Bp remains elevated. -     CMP14+EGFR   See After Visit Summary for Counseling Recommendations  

## 2019-09-19 NOTE — Progress Notes (Signed)
Needs a 90 day supply of the medication she was given or else it was $100 for 30 day supply  Concerns with the way zyrtec make her mouth feel.  Needs another medication for allergies.

## 2019-09-20 DIAGNOSIS — Z1211 Encounter for screening for malignant neoplasm of colon: Secondary | ICD-10-CM | POA: Diagnosis not present

## 2019-09-20 LAB — CBC WITH DIFFERENTIAL/PLATELET
Basophils Absolute: 0 10*3/uL (ref 0.0–0.2)
Basos: 1 %
EOS (ABSOLUTE): 0.4 10*3/uL (ref 0.0–0.4)
Eos: 6 %
Hematocrit: 42 % (ref 34.0–46.6)
Hemoglobin: 13.4 g/dL (ref 11.1–15.9)
Immature Grans (Abs): 0 10*3/uL (ref 0.0–0.1)
Immature Granulocytes: 0 %
Lymphocytes Absolute: 2.5 10*3/uL (ref 0.7–3.1)
Lymphs: 36 %
MCH: 27.3 pg (ref 26.6–33.0)
MCHC: 31.9 g/dL (ref 31.5–35.7)
MCV: 86 fL (ref 79–97)
Monocytes Absolute: 0.7 10*3/uL (ref 0.1–0.9)
Monocytes: 10 %
Neutrophils Absolute: 3.5 10*3/uL (ref 1.4–7.0)
Neutrophils: 47 %
Platelets: 325 10*3/uL (ref 150–450)
RBC: 4.9 x10E6/uL (ref 3.77–5.28)
RDW: 12.3 % (ref 11.7–15.4)
WBC: 7.2 10*3/uL (ref 3.4–10.8)

## 2019-09-20 LAB — CERVICOVAGINAL ANCILLARY ONLY
Bacterial Vaginitis (gardnerella): NEGATIVE
Candida Glabrata: NEGATIVE
Candida Vaginitis: NEGATIVE
Chlamydia: NEGATIVE
Comment: NEGATIVE
Comment: NEGATIVE
Comment: NEGATIVE
Comment: NEGATIVE
Comment: NEGATIVE
Comment: NORMAL
Neisseria Gonorrhea: NEGATIVE
Trichomonas: NEGATIVE

## 2019-09-20 LAB — CMP14+EGFR
ALT: 8 IU/L (ref 0–32)
AST: 22 IU/L (ref 0–40)
Albumin/Globulin Ratio: 1.3 (ref 1.2–2.2)
Albumin: 4.9 g/dL — ABNORMAL HIGH (ref 3.8–4.8)
Alkaline Phosphatase: 110 IU/L (ref 48–121)
BUN/Creatinine Ratio: 17 (ref 12–28)
BUN: 13 mg/dL (ref 8–27)
Bilirubin Total: 0.3 mg/dL (ref 0.0–1.2)
CO2: 27 mmol/L (ref 20–29)
Calcium: 10.4 mg/dL — ABNORMAL HIGH (ref 8.7–10.3)
Chloride: 96 mmol/L (ref 96–106)
Creatinine, Ser: 0.77 mg/dL (ref 0.57–1.00)
GFR calc Af Amer: 94 mL/min/{1.73_m2} (ref >59)
GFR calc non Af Amer: 82 mL/min/{1.73_m2} (ref >59)
Globulin, Total: 3.7 g/dL (ref 1.5–4.5)
Glucose: 91 mg/dL (ref 65–99)
Potassium: 4.1 mmol/L (ref 3.5–5.2)
Sodium: 141 mmol/L (ref 134–144)
Total Protein: 8.6 g/dL — ABNORMAL HIGH (ref 6.0–8.5)

## 2019-09-20 LAB — TSH+FREE T4
Free T4: 1.3 ng/dL (ref 0.82–1.77)
TSH: 2.04 u[IU]/mL (ref 0.450–4.500)

## 2019-09-20 LAB — CYTOLOGY - PAP: Diagnosis: NEGATIVE

## 2019-09-21 ENCOUNTER — Other Ambulatory Visit (INDEPENDENT_AMBULATORY_CARE_PROVIDER_SITE_OTHER): Payer: Self-pay | Admitting: Primary Care

## 2019-09-21 DIAGNOSIS — I10 Essential (primary) hypertension: Secondary | ICD-10-CM

## 2019-09-21 NOTE — Telephone Encounter (Signed)
Requested Prescriptions  Pending Prescriptions Disp Refills  . losartan (COZAAR) 25 MG tablet [Pharmacy Med Name: LOSARTAN POTASSIUM 25 MG TAB] 90 tablet 0    Sig: TAKE 1 TABLET BY MOUTH EVERY DAY     Cardiovascular:  Angiotensin Receptor Blockers Failed - 09/21/2019 11:32 AM      Failed - Last BP in normal range    BP Readings from Last 1 Encounters:  09/19/19 (!) 144/86         Passed - Cr in normal range and within 180 days    Creatinine, Ser  Date Value Ref Range Status  09/19/2019 0.77 0.57 - 1.00 mg/dL Final         Passed - K in normal range and within 180 days    Potassium  Date Value Ref Range Status  09/19/2019 4.1 3.5 - 5.2 mmol/L Final         Passed - Patient is not pregnant      Passed - Valid encounter within last 6 months    Recent Outpatient Visits          2 days ago Cervical cancer screening   [facility] Grayce Sessions, NP   3 weeks ago Essential hypertension   [facility] Grayce Sessions, NP   1 year ago Essential hypertension   [facility] Loletta Specter, PA-C   1 year ago Essential hypertension   [facility] Loletta Specter, PA-C   1 year ago Acute upper respiratory infection   [facility] Loletta Specter, PA-C      Future Appointments            In 3 weeks Jodelle Red, MD [facility] [place], [facility]

## 2019-09-21 NOTE — Telephone Encounter (Signed)
Sent to PCP ?

## 2019-09-26 ENCOUNTER — Other Ambulatory Visit (INDEPENDENT_AMBULATORY_CARE_PROVIDER_SITE_OTHER): Payer: Self-pay | Admitting: Primary Care

## 2019-09-26 MED ORDER — HYDROCHLOROTHIAZIDE 25 MG PO TABS: 25.0000 mg | ORAL_TABLET | Freq: Every day | ORAL | 1 refills | Status: DC

## 2019-09-27 DIAGNOSIS — H401132 Primary open-angle glaucoma, bilateral, moderate stage: Secondary | ICD-10-CM | POA: Diagnosis not present

## 2019-10-10 DIAGNOSIS — D179 Benign lipomatous neoplasm, unspecified: Secondary | ICD-10-CM | POA: Diagnosis not present

## 2019-10-10 DIAGNOSIS — Z1211 Encounter for screening for malignant neoplasm of colon: Secondary | ICD-10-CM | POA: Diagnosis not present

## 2019-10-10 DIAGNOSIS — D175 Benign lipomatous neoplasm of intra-abdominal organs: Secondary | ICD-10-CM | POA: Diagnosis not present

## 2019-10-12 ENCOUNTER — Ambulatory Visit: Payer: BC Managed Care – PPO | Admitting: Cardiology

## 2019-10-12 ENCOUNTER — Encounter: Payer: Self-pay | Admitting: Cardiology

## 2019-10-12 ENCOUNTER — Other Ambulatory Visit: Payer: Self-pay

## 2019-10-12 VITALS — BP 130/70 | HR 67 | Ht 63.0 in | Wt 111.0 lb

## 2019-10-12 DIAGNOSIS — Z7189 Other specified counseling: Secondary | ICD-10-CM

## 2019-10-12 DIAGNOSIS — I10 Essential (primary) hypertension: Secondary | ICD-10-CM | POA: Diagnosis not present

## 2019-10-12 DIAGNOSIS — E7800 Pure hypercholesterolemia, unspecified: Secondary | ICD-10-CM | POA: Diagnosis not present

## 2019-10-12 MED ORDER — PRAVASTATIN SODIUM 20 MG PO TABS: 20.0000 mg | ORAL_TABLET | Freq: Every evening | ORAL | 3 refills | Status: DC

## 2019-10-12 MED ORDER — CARVEDILOL 6.25 MG PO TABS: 6.2500 mg | ORAL_TABLET | Freq: Two times a day (BID) | ORAL | 3 refills | Status: DC

## 2019-10-12 MED ORDER — HYDROCHLOROTHIAZIDE 25 MG PO TABS: 25.0000 mg | ORAL_TABLET | Freq: Every day | ORAL | 3 refills | Status: DC

## 2019-10-12 MED ORDER — LOSARTAN POTASSIUM 25 MG PO TABS: 25.0000 mg | ORAL_TABLET | Freq: Every day | ORAL | 3 refills | Status: DC

## 2019-10-12 MED ORDER — AMLODIPINE BESYLATE 10 MG PO TABS: 10.0000 mg | ORAL_TABLET | Freq: Every day | ORAL | 3 refills | Status: DC

## 2019-10-12 NOTE — Progress Notes (Signed)
Cardiology Office Note:    Date:  10/12/2019   ID:  Honest Safranek, DOB 1955/11/04, MRN [id number]  PCP:  Grayce Sessions, NP  Cardiologist:  Jodelle Red, MD PhD  Referring MD: Grayce Sessions, NP   CC: follow up  History of Present Illness:    Rachael Smith is a 64 y.o. female with a hx of hypertension who is seen in follow up. Initial consult note 04/19/18.   Cardiac history: seen initially for chest pain. ETT negative. Followed up for elevated BP in HTN clinic and with me 06/2018.   Today: Doing ok. Niece just had a stroke, she is only 67 but is a diabetic.  Noted hasn't had any water today, wants to come back for lipids. Had rest of labs checked recently.  Blood pressure well controlled. No issues with medications. She takes them regularly. Did extensive med rec today, refilled all antihypertensives/statin. Notes that aspirin makes her mouth feel funny. As this is for prevention only, ok to stop today.  Denies chest pain, shortness of breath at rest or with normal exertion. No PND, orthopnea, LE edema or unexpected weight gain. No syncope or palpitations.   Past Medical History:  Diagnosis Date  . Hypertension     Past Surgical History:  Procedure Laterality Date  . ABDOMINAL HYSTERECTOMY      Current Medications: Current Outpatient Medications on File Prior to Visit  Medication Sig  . amLODipine (NORVASC) 10 MG tablet Take 1 tablet (10 mg total) by mouth daily.  [name] aspirin EC 81 MG tablet Take 1 tablet (81 mg total) by mouth daily.  . carvedilol (COREG) 6.25 MG tablet TAKE 1 TABLET (6.25 MG TOTAL) BY MOUTH 2 (TWO) TIMES DAILY WITH A MEAL.  . hydrochlorothiazide (HYDRODIURIL) 25 MG tablet Take 1 tablet (25 mg total) by mouth daily. Take on tablet in the morning.  [name] losartan (COZAAR) 25 MG tablet TAKE 1 TABLET BY MOUTH EVERY DAY  . nitroGLYCERIN (NITROSTAT) 0.4 MG SL tablet Place 1 tablet (0.4 mg total) under the tongue every 5 (five) minutes as  needed for chest pain.  . diphenhydrAMINE (BENADRYL) 25 MG tablet Take 1 tablet (25 mg total) by mouth every 6 (six) hours. (Patient taking differently: Take 25 mg by mouth every 6 (six) hours as needed. )  . pravastatin (PRAVACHOL) 20 MG tablet Take 1 tablet (20 mg total) by mouth every evening.   No current facility-administered medications on file prior to visit.     Allergies:   Ibuprofen and Zyrtec [cetirizine]   Social History   Tobacco Use  . Smoking status: Never Smoker  . Smokeless tobacco: Never Used  Vaping Use  . Vaping Use: Never used  Substance Use Topics  . Alcohol use: No  . Drug use: No    Family History: father had MI, not sure of age. No other MI or CVA, no other heart disease  ROS:   Please see the history of present illness.  Additional pertinent ROS otherwise unremarkable.  EKGs/Labs/Other Studies Reviewed:    The following studies were reviewed today: ETT 05/18/18 normal  EKG:  EKG is personally reviewed.  The ekg ordered today demonstrates normal sinus rhythm at 67 bpm  Recent Labs: 09/19/2019: ALT 8; BUN 13; Creatinine, Ser 0.77; Hemoglobin 13.4; Platelets 325; Potassium 4.1; Sodium 141; TSH 2.040  Recent Lipid Panel    Component Value Date/Time   CHOL 251 (H) 09/21/2018 0833   TRIG 75 09/21/2018 0833   HDL 65 09/21/2018 7672  CHOLHDL 3.9 09/21/2018 0833   LDLCALC 171 (H) 09/21/2018 0833    Physical Exam:    VS:  BP 130/70   Pulse 67   Ht 5\' 3"  (1.6 m)   Wt 111 lb (50.3 kg)   SpO2 97%   BMI 19.66 kg/m     Wt Readings from Last 3 Encounters:  10/12/19 111 lb (50.3 kg)  08/29/19 116 lb 12.8 oz (53 kg)  09/21/18 111 lb 4.8 oz (50.5 kg)    GEN: Well nourished, well developed in no acute distress HEENT: Normal, moist mucous membranes NECK: No JVD CARDIAC: regular rhythm, normal S1 and S2, no rubs or gallops. No murmur. VASCULAR: Radial and DP pulses 2+ bilaterally. No carotid bruits RESPIRATORY:  Clear to auscultation without rales,  wheezing or rhonchi  ABDOMEN: Soft, non-tender, non-distended MUSCULOSKELETAL:  Ambulates independently SKIN: Warm and dry, no edema NEUROLOGIC:  Alert and oriented x 3. No focal neuro deficits noted. PSYCHIATRIC:  Normal affect   ASSESSMENT:    1. Pure hypercholesterolemia   2. Essential hypertension   3. Cardiac risk counseling   4. Counseling on health promotion and disease prevention    PLAN:    Hypertension: at goal today  -continue losartan 25 mg daily -continue amlodipine 10 mg daily -continue carvedilol 6.25 mg BID -continue HCTZ 25 mg daily  Hypercholesterolemia: -recheck today -last lipids 09/2018 show LDL 171 -on pravastatin 20 mg. We have discussed intensifying based on her lipid results  Primary prevention and CV risk counseling -recommend heart healthy/Mediterranean diet, with whole grains, fruits, vegetable, fish, lean meats, nuts, and olive oil. Limit salt. -recommend moderate walking, 3-5 times/week for 30-50 minutes each session. Aim for at least 150 minutes.week. Goal should be pace of 3 miles/hours, or walking 1.5 miles in 30 minutes -recommend avoidance of tobacco products. Avoid excess alcohol. -ASCVD risk score: The 10-year ASCVD risk score 10/2018 DC Denman George., et al., 2013) is: 10.5%   Values used to calculate the score:     Age: 58 years     Sex: Female     Is Non-Hispanic African American: Yes     Diabetic: No     Tobacco smoker: No     Systolic Blood Pressure: 130 mmHg     Is BP treated: Yes     HDL Cholesterol: 65 mg/dL     Total Cholesterol: 251 mg/dL  -currently on aspirin 81 mg daily. Discussed current guidelines. If her ASCVD risk is still elevated, would start low dose pravastatin. At that time could stop the aspirin. Discussed with her today and she is amenable  Plan for follow up: 1 year or sooner PRN  Medication Adjustments/Labs and Tests Ordered: Current medicines are reviewed at length with the patient today.  Concerns regarding medicines  are outlined above.   Patient Instructions  Medication Instructions:  NO CHANGES *If you need a refill on your cardiac medications before your next appointment, please call your pharmacy*   Lab Work: LIPIDS- FASTING  If you have labs (blood work) drawn today and your tests are completely normal, you will receive your results only by: 77 MyChart Message (if you have MyChart) OR . A paper copy in the mail If you have any lab test that is abnormal or we need to change your treatment, we will call you to review the results.   Testing/Procedures: NOT  NEEDED   Follow-Up: At [facility], you and your health needs are our priority.  As part of our continuing mission to  provide you with exceptional heart care, we have created designated Provider Care Teams.  These Care Teams include your primary Cardiologist (physician) and Advanced Practice Providers (APPs -  Physician Assistants and Nurse Practitioners) who all work together to provide you with the care you need, when you need it.  We recommend signing up for the patient portal called "MyChart".  Sign up information is provided on this After Visit Summary.  MyChart is used to connect with patients for Virtual Visits (Telemedicine).  Patients are able to view lab/test results, encounter notes, upcoming appointments, etc.  Non-urgent messages can be sent to your provider as well.   To learn more about what you can do with MyChart, go to [email or web address].    Your next appointment:   12 month(s)- JULY 2022  The format for your next appointment:   In Person  Provider:   Jodelle Red, MD   Other Instructions    Signed, Jodelle Red, MD PhD 10/12/2019     [facility]

## 2019-10-12 NOTE — Patient Instructions (Addendum)
Medication Instructions:  NO CHANGES *If you need a refill on your cardiac medications before your next appointment, please call your pharmacy*   Lab Work: LIPIDS- FASTING  If you have labs (blood work) drawn today and your tests are completely normal, you will receive your results only by: [name] MyChart Message (if you have MyChart) OR . A paper copy in the mail If you have any lab test that is abnormal or we need to change your treatment, we will call you to review the results.   Testing/Procedures: NOT  NEEDED   Follow-Up: At [facility], you and your health needs are our priority.  As part of our continuing mission to provide you with exceptional heart care, we have created designated Provider Care Teams.  These Care Teams include your primary Cardiologist (physician) and Advanced Practice Providers (APPs -  Physician Assistants and Nurse Practitioners) who all work together to provide you with the care you need, when you need it.  We recommend signing up for the patient portal called "MyChart".  Sign up information is provided on this After Visit Summary.  MyChart is used to connect with patients for Virtual Visits (Telemedicine).  Patients are able to view lab/test results, encounter notes, upcoming appointments, etc.  Non-urgent messages can be sent to your provider as well.   To learn more about what you can do with MyChart, go to [email or web address].    Your next appointment:   12 month(s)- JULY 2022  The format for your next appointment:   In Person  Provider:   Jodelle Red, MD   Other Instructions

## 2019-12-29 ENCOUNTER — Encounter: Payer: Self-pay | Admitting: Cardiology

## 2020-08-27 ENCOUNTER — Encounter (INDEPENDENT_AMBULATORY_CARE_PROVIDER_SITE_OTHER): Payer: Self-pay | Admitting: Primary Care

## 2020-08-27 ENCOUNTER — Ambulatory Visit (INDEPENDENT_AMBULATORY_CARE_PROVIDER_SITE_OTHER): Payer: Medicare Other | Admitting: Primary Care

## 2020-08-27 ENCOUNTER — Other Ambulatory Visit: Payer: Self-pay

## 2020-08-27 VITALS — BP 187/100 | HR 68 | Temp 97.5°F | Ht 62.0 in | Wt 111.2 lb

## 2020-08-27 DIAGNOSIS — H6123 Impacted cerumen, bilateral: Secondary | ICD-10-CM | POA: Diagnosis not present

## 2020-08-27 DIAGNOSIS — I10 Essential (primary) hypertension: Secondary | ICD-10-CM | POA: Diagnosis not present

## 2020-08-27 MED ORDER — CLONIDINE HCL 0.1 MG PO TABS
0.1000 mg | ORAL_TABLET | Freq: Once | ORAL | Status: AC
  Administered 2020-08-27: 0.1 mg via ORAL

## 2020-08-27 NOTE — Progress Notes (Signed)
Pt states she has been out of BP medication for 2 weeks    Established Patient Office Visit  Subjective:  Patient ID: Rachael Smith, female    DOB: 10/15/1955  Age: 65 y.o. MRN: [id number]  CC:  Chief Complaint  Patient presents with   Blood Pressure Check    HPI Rachael Smith presents for HTN- management -Denies shortness of breath, headaches, chest pain or lower extremity edema. She has been out of medication for 2 or more weeks. Previously been followed by cardiology .   Past Medical History:  Diagnosis Date   Hypertension     Past Surgical History:  Procedure Laterality Date   ABDOMINAL HYSTERECTOMY      History reviewed. No pertinent family history.  Social History   Socioeconomic History   Marital status: Single    Spouse name: Not on file   Number of children: Not on file   Years of education: Not on file   Highest education level: Not on file  Occupational History   Not on file  Tobacco Use   Smoking status: Never   Smokeless tobacco: Never  Vaping Use   Vaping Use: Never used  Substance and Sexual Activity   Alcohol use: No   Drug use: No   Sexual activity: Not on file  Other Topics Concern   Not on file  Social History Narrative   Not on file   Social Determinants of Health   Financial Resource Strain: Not on file  Food Insecurity: Not on file  Transportation Needs: Not on file  Physical Activity: Not on file  Stress: Not on file  Social Connections: Not on file  Intimate Partner Violence: Not on file    Outpatient Medications Prior to Visit  Medication Sig Dispense Refill   amLODipine (NORVASC) 10 MG tablet Take 1 tablet (10 mg total) by mouth daily. 90 tablet 3   carvedilol (COREG) 6.25 MG tablet Take 1 tablet (6.25 mg total) by mouth 2 (two) times daily with a meal. 180 tablet 3   hydrochlorothiazide (HYDRODIURIL) 25 MG tablet Take 1 tablet (25 mg total) by mouth daily. Take on tablet in the morning. 90 tablet 3   losartan  (COZAAR) 25 MG tablet Take 1 tablet (25 mg total) by mouth daily. 90 tablet 3   pravastatin (PRAVACHOL) 20 MG tablet Take 1 tablet (20 mg total) by mouth every evening. 90 tablet 3   diphenhydrAMINE (BENADRYL) 25 MG tablet Take 1 tablet (25 mg total) by mouth every 6 (six) hours. (Patient taking differently: Take 25 mg by mouth every 6 (six) hours as needed. ) 20 tablet 0   nitroGLYCERIN (NITROSTAT) 0.4 MG SL tablet Place 1 tablet (0.4 mg total) under the tongue every 5 (five) minutes as needed for chest pain. (Patient not taking: Reported on 08/27/2020) 25 tablet 2   No facility-administered medications prior to visit.    Allergies  Allergen Reactions   Ibuprofen Swelling   Zyrtec [Cetirizine]     Mouth feels funny    ROS Review of Systems  All other systems reviewed and are negative.    Objective:    Physical Exam Vitals reviewed.  Constitutional:      Appearance: Normal appearance. She is normal weight.  HENT:     Head: Normocephalic and atraumatic.     Right Ear: There is impacted cerumen.     Left Ear: There is impacted cerumen.     Nose: Nose normal.  Eyes:     Extraocular Movements:  Extraocular movements intact.  Cardiovascular:     Rate and Rhythm: Normal rate and regular rhythm.  Pulmonary:     Effort: Pulmonary effort is normal.     Breath sounds: Normal breath sounds.  Abdominal:     General: Abdomen is flat. Bowel sounds are normal.     Palpations: Abdomen is soft.  Musculoskeletal:        General: Normal range of motion.     Cervical back: Normal range of motion.  Skin:    General: Skin is warm and dry.  Neurological:     Mental Status: She is alert and oriented to person, place, and time.  Psychiatric:        Mood and Affect: Mood normal.        Behavior: Behavior normal.        Thought Content: Thought content normal.        Judgment: Judgment normal.   BP (!) 187/100 (BP Location: Right Arm, Patient Position: Sitting, Cuff Size: Normal)   Pulse 68    Temp (!) 97.5 F (36.4 C) (Temporal)   Ht 5\' 2"  (1.575 m)   Wt 111 lb 3.2 oz (50.4 kg)   SpO2 96%   BMI 20.34 kg/m  Wt Readings from Last 3 Encounters:  08/27/20 111 lb 3.2 oz (50.4 kg)  10/12/19 111 lb (50.3 kg)  08/29/19 116 lb 12.8 oz (53 kg)     Health Maintenance Due  Topic Date Due   COVID-19 Vaccine (1) Never done   Zoster Vaccines- Shingrix (1 of 2) Never done   MAMMOGRAM  08/07/2016   DEXA SCAN  Never done    There are no preventive care reminders to display for this patient.  Lab Results  Component Value Date   TSH 2.040 09/19/2019   Lab Results  Component Value Date   WBC 7.2 09/19/2019   HGB 13.4 09/19/2019   HCT 42.0 09/19/2019   MCV 86 09/19/2019   PLT 325 09/19/2019   Lab Results  Component Value Date   NA 141 09/19/2019   K 4.1 09/19/2019   CO2 27 09/19/2019   GLUCOSE 91 09/19/2019   BUN 13 09/19/2019   CREATININE 0.77 09/19/2019   BILITOT 0.3 09/19/2019   ALKPHOS 110 09/19/2019   AST 22 09/19/2019   ALT 8 09/19/2019   PROT 8.6 (H) 09/19/2019   ALBUMIN 4.9 (H) 09/19/2019   CALCIUM 10.4 (H) 09/19/2019   ANIONGAP 10 04/12/2018   Lab Results  Component Value Date   CHOL 251 (H) 09/21/2018   Lab Results  Component Value Date   HDL 65 09/21/2018   Lab Results  Component Value Date   LDLCALC 171 (H) 09/21/2018   Lab Results  Component Value Date   TRIG 75 09/21/2018   Lab Results  Component Value Date   CHOLHDL 3.9 09/21/2018   No results found for: HGBA1C    Assessment & Plan:  Sueko was seen today for blood pressure check.  Diagnoses and all orders for this visit:  Essential hypertension -     cloNIDine (CATAPRES) tablet 0.1 mg Reviewed medication the amount dispensed and the refills. All Bp medications have refills until f/u which is needed in July. Had patient to call during this visit to schedule appt . Unable to reach phones just rung. Encourage to call first thing in the AM. Counseled on blood pressure goal of less  than 130/80, low-sodium, DASH diet, medication compliance, 150 minutes of moderate intensity exercise per week. Discussed  medication compliance, adverse effects.     Bilateral impacted cerumen Schedule f/u for ear lavage   Meds ordered this encounter  Medications   cloNIDine (CATAPRES) tablet 0.1 mg    Follow-up: Return for schedule appt for ear lavage.    Grayce Sessions, NP

## 2020-08-29 ENCOUNTER — Telehealth (INDEPENDENT_AMBULATORY_CARE_PROVIDER_SITE_OTHER): Payer: Self-pay

## 2020-08-29 NOTE — Telephone Encounter (Signed)
Copied from CRM [id number]. Topic: General - Other >> Aug 28, 2020  8:53 AM Aretta Nip wrote: Reason for CRM:pt just wanted to let Marcelino Duster know that she scheduled her appt with heart dr for 8/2.  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

## 2020-09-12 ENCOUNTER — Ambulatory Visit (INDEPENDENT_AMBULATORY_CARE_PROVIDER_SITE_OTHER): Payer: Medicare Other | Admitting: Primary Care

## 2020-09-12 ENCOUNTER — Other Ambulatory Visit: Payer: Self-pay

## 2020-09-12 DIAGNOSIS — H6123 Impacted cerumen, bilateral: Secondary | ICD-10-CM

## 2020-09-22 NOTE — Progress Notes (Signed)
[id number]  Arrival Time:   [id number] impaction   SUBJECTIVE:   Rachael Smith is a 65 y.o. female who presents ear lavage.  Denies fever, chills, fatigue, sinus pain, rhinorrhea, ear discharge, sore throat, SOB, wheezing, chest pain, nausea, changes in bowel or bladder habits.    ROS: As per HPI.  All other pertinent ROS negative.     Past Medical History:  Diagnosis Date   Hypertension    Past Surgical History:  Procedure Laterality Date   ABDOMINAL HYSTERECTOMY     Allergies  Allergen Reactions   Ibuprofen Swelling   Zyrtec [Cetirizine]     Mouth feels funny   Current Outpatient Medications on File Prior to Visit  Medication Sig Dispense Refill   amLODipine (NORVASC) 10 MG tablet Take 1 tablet (10 mg total) by mouth daily. 90 tablet 3   carvedilol (COREG) 6.25 MG tablet Take 1 tablet (6.25 mg total) by mouth 2 (two) times daily with a meal. 180 tablet 3   diphenhydrAMINE (BENADRYL) 25 MG tablet Take 1 tablet (25 mg total) by mouth every 6 (six) hours. (Patient taking differently: Take 25 mg by mouth every 6 (six) hours as needed. ) 20 tablet 0   hydrochlorothiazide (HYDRODIURIL) 25 MG tablet Take 1 tablet (25 mg total) by mouth daily. Take on tablet in the morning. 90 tablet 3   losartan (COZAAR) 25 MG tablet Take 1 tablet (25 mg total) by mouth daily. 90 tablet 3   nitroGLYCERIN (NITROSTAT) 0.4 MG SL tablet Place 1 tablet (0.4 mg total) under the tongue every 5 (five) minutes as needed for chest pain. (Patient not taking: Reported on 08/27/2020) 25 tablet 2   pravastatin (PRAVACHOL) 20 MG tablet Take 1 tablet (20 mg total) by mouth every evening. 90 tablet 3   No current facility-administered medications on file prior to visit.   Social History   Socioeconomic History   Marital status: Single    Spouse name: Not on file   Number of children: Not on file   Years of education: Not on file   Highest education level: Not on file  Occupational History   Not on file   Tobacco Use   Smoking status: Never   Smokeless tobacco: Never  Vaping Use   Vaping Use: Never used  Substance and Sexual Activity   Alcohol use: No   Drug use: No   Sexual activity: Not on file  Other Topics Concern   Not on file  Social History Narrative   Not on file   Social Determinants of Health   Financial Resource Strain: Not on file  Food Insecurity: Not on file  Transportation Needs: Not on file  Physical Activity: Not on file  Stress: Not on file  Social Connections: Not on file  Intimate Partner Violence: Not on file   No family history on file.  OBJECTIVE:  There were no vitals filed for this visit.   General appearance: alert; appears fatigued HEENT: Ears: EACs cerumen impaction ; Eyes: PERRL, EOMI grossly; Sinuses nontender to palpation; Nose: clear rhinorrhea; TNeck: supple without LAD Lungs: unlabored respirations, symmetrical air entry; cough: absent; no respiratory distress Heart: regular rate and rhythm.  Radial pulses 2+ symmetrical bilaterally Skin: warm and dry Psychological: alert and cooperative; normal mood and affect  Imaging: No results found.   ASSESSMENT & PLAN: Diagnoses and all orders for this visit:  Bilateral impacted cerumen   PROCEDURE: CERUMEN DISIMPACTION   The patient had a large amount of cerumen in the  external auditory canal(s): bilateral Ear wax softener was used prior to the lavage. Ear lavage was performed on bilateral ear(s) Curettage by provider was not performed in addition. There were no complications and following the disimpaction the tympanic membrane was visible. Charges to be entered in Charge Capture section.     Grayce Sessions

## 2020-10-17 ENCOUNTER — Telehealth: Payer: Self-pay

## 2020-10-21 NOTE — Progress Notes (Signed)
Cardiology Office Note:    Date:  10/23/2020   ID:  Rachael Smith, DOB 06/18/1955, MRN [id number]  PCP:  Grayce Sessions, NP  Cardiologist:  Jodelle Red, MD PhD  Referring MD: Grayce Sessions, NP   CC: follow up  History of Present Illness:    Rachael Smith is a 65 y.o. female with a hx of hypertension who is seen in follow up. Initial consult note 04/19/18.   Cardiac history: seen initially for chest pain. ETT negative. Followed up for elevated BP in HTN clinic and with me 06/2018.   Today: Since her last visit she is doing well and denies any episodes of chest pain. At home, her blood pressure is normally around 141/80. At one time she noticed her blood pressure was in the 90's systolic, but this is not typical. Her blood pressure is elevated in clinic today. However, she notes she was worried about being late while trying to find our office.  She denies any palpitations or shortness of breath. No lightheadedness, headaches, syncope, orthopnea, or PND. Also has no lower extremity edema or exertional symptoms.  She requires a refill of Losartan.  Past Medical History:  Diagnosis Date   Hypertension     Past Surgical History:  Procedure Laterality Date   ABDOMINAL HYSTERECTOMY      Current Medications: Current Outpatient Medications on File Prior to Visit  Medication Sig   amLODipine (NORVASC) 10 MG tablet Take 1 tablet (10 mg total) by mouth daily.   carvedilol (COREG) 6.25 MG tablet Take 1 tablet (6.25 mg total) by mouth 2 (two) times daily with a meal.   diphenhydrAMINE (BENADRYL) 25 MG tablet Take 1 tablet (25 mg total) by mouth every 6 (six) hours. (Patient taking differently: Take 25 mg by mouth every 6 (six) hours as needed.)   hydrochlorothiazide (HYDRODIURIL) 25 MG tablet Take 1 tablet (25 mg total) by mouth daily. Take on tablet in the morning.   nitroGLYCERIN (NITROSTAT) 0.4 MG SL tablet Place 1 tablet (0.4 mg total) under the tongue every 5  (five) minutes as needed for chest pain.   pravastatin (PRAVACHOL) 20 MG tablet Take 1 tablet (20 mg total) by mouth every evening.   No current facility-administered medications on file prior to visit.     Allergies:   Ibuprofen and Zyrtec [cetirizine]   Social History   Tobacco Use   Smoking status: Never   Smokeless tobacco: Never  Vaping Use   Vaping Use: Never used  Substance Use Topics   Alcohol use: No   Drug use: No    Family History: father had MI, not sure of age. No other MI or CVA, no other heart disease  ROS:   Please see the history of present illness.   Additional pertinent ROS otherwise unremarkable.  EKGs/Labs/Other Studies Reviewed:    The following studies were reviewed today:  ETT 05/11/18 normal  EKG:  EKG is personally reviewed.   10/22/2020: NSR  at 68 bpm 10/12/2019: normal sinus rhythm at 67 bpm  Recent Labs: No results found for requested labs within last 8760 hours.  Recent Lipid Panel    Component Value Date/Time   CHOL 251 (H) 09/21/2018 0833   TRIG 75 09/21/2018 0833   HDL 65 09/21/2018 0833   CHOLHDL 3.9 09/21/2018 0833   LDLCALC 171 (H) 09/21/2018 0833    Physical Exam:    VS:  BP (!) 156/82 (BP Location: Left Arm, Patient Position: Sitting)   Pulse 68  Ht 5\' 2"  (1.575 m)   Wt 111 lb 9.6 oz (50.6 kg)   BMI 20.41 kg/m     Wt Readings from Last 3 Encounters:  10/22/20 111 lb 9.6 oz (50.6 kg)  08/27/20 111 lb 3.2 oz (50.4 kg)  10/12/19 111 lb (50.3 kg)    GEN: Well nourished, well developed in no acute distress HEENT: Normal, moist mucous membranes NECK: No JVD CARDIAC: regular rhythm, normal S1 and S2, no rubs or gallops. No murmur. VASCULAR: Radial and DP pulses 2+ bilaterally. No carotid bruits RESPIRATORY:  Clear to auscultation without rales, wheezing or rhonchi  ABDOMEN: Soft, non-tender, non-distended MUSCULOSKELETAL:  Ambulates independently SKIN: Warm and dry, no edema NEUROLOGIC:  Alert and oriented x 3. No  focal neuro deficits noted. PSYCHIATRIC:  Normal affect   ASSESSMENT:    1. Essential hypertension   2. Pure hypercholesterolemia   3. Cardiac risk counseling   4. Counseling on health promotion and disease prevention     PLAN:    Hypertension: initially 166/92, decreased slightly on recheck but still above goal of <130/80 -will increase losartan to 50 mg daily today, recheck BMET in several weeks. Will have her follow up with our PharmD clinic or 10/14/19 in several weeks for BP recheck -continue amlodipine 10 mg daily -continue carvedilol 6.25 mg BID -continue HCTZ 25 mg daily  Hypercholesterolemia: -recheck ordered today -last lipids 09/2018 show LDL 171 -on pravastatin 20 mg. We have discussed intensifying based on her lipid results  Primary prevention and CV risk counseling -recommend heart healthy/Mediterranean diet, with whole grains, fruits, vegetable, fish, lean meats, nuts, and olive oil. Limit salt. -recommend moderate walking, 3-5 times/week for 30-50 minutes each session. Aim for at least 150 minutes.week. Goal should be pace of 3 miles/hours, or walking 1.5 miles in 30 minutes -recommend avoidance of tobacco products. Avoid excess alcohol. -ASCVD risk score: The 10-year ASCVD risk score 10/2018 DC Denman George., et al., 2013) is: 16.9%   Values used to calculate the score:     Age: 78 years     Sex: Female     Is Non-Hispanic African American: Yes     Diabetic: No     Tobacco smoker: No     Systolic Blood Pressure: 156 mmHg     Is BP treated: Yes     HDL Cholesterol: 65 mg/dL     Total Cholesterol: 251 mg/dL   Plan for follow up: 6 months or sooner PRN with me, 3-4 weeks with team for BP check  Medication Adjustments/Labs and Tests Ordered: Current medicines are reviewed at length with the patient today.  Concerns regarding medicines are outlined above.   Patient Instructions  Medication Instructions:  Increase Losartan to 50 mg daily  *If you need a refill on  your cardiac medications before your next appointment, please call your pharmacy*   Lab Work: BMP and Fasting Lipids in 3-4 weeks  If you have labs (blood work) drawn today and your tests are completely normal, you will receive your results only by: MyChart Message (if you have MyChart) OR A paper copy in the mail If you have any lab test that is abnormal or we need to change your treatment, we will call you to review the results.   Testing/Procedures: None Ordered   Follow-Up: At [facility], you and your health needs are our priority.  As part of our continuing mission to provide you with exceptional heart care, we have created designated Provider Care Teams.  These  Care Teams include your primary Cardiologist (physician) and Advanced Practice Providers (APPs -  Physician Assistants and Nurse Practitioners) who all work together to provide you with the care you need, when you need it.  We recommend signing up for the patient portal called "MyChart".  Sign up information is provided on this After Visit Summary.  MyChart is used to connect with patients for Virtual Visits (Telemedicine).  Patients are able to view lab/test results, encounter notes, upcoming appointments, etc.  Non-urgent messages can be sent to your provider as well.   To learn more about what you can do with MyChart, go to [email or web address].    Your next appointment:   6 month(s)  The format for your next appointment:   In Person  Provider:   Jodelle Red, MD  Your physician recommends that you schedule a follow-up appointment in: 2-3 weeks with PharmD or Gillian Shields for blood pressure check     I,Mathew Stumpf,acting as a scribe for [employer], MD.,have documented all relevant documentation on the behalf of Jodelle Red, MD,as directed by  Jodelle Red, MD while in the presence of Jodelle Red, MD.  I, Jodelle Red, MD, have reviewed all  documentation for this visit. The documentation on 10/23/20 for the exam, diagnosis, procedures, and orders are all accurate and complete.   Signed, Jodelle Red, MD PhD 10/23/2020     [facility]

## 2020-10-21 NOTE — Telephone Encounter (Signed)
Pt contacted and agreed to schedule with the mobile mammo for August.

## 2020-10-22 ENCOUNTER — Other Ambulatory Visit: Payer: Self-pay

## 2020-10-22 ENCOUNTER — Ambulatory Visit (HOSPITAL_BASED_OUTPATIENT_CLINIC_OR_DEPARTMENT_OTHER): Payer: Medicare Other | Admitting: Cardiology

## 2020-10-22 VITALS — BP 156/82 | HR 68 | Ht 62.0 in | Wt 111.6 lb

## 2020-10-22 DIAGNOSIS — I10 Essential (primary) hypertension: Secondary | ICD-10-CM | POA: Diagnosis not present

## 2020-10-22 DIAGNOSIS — Z7189 Other specified counseling: Secondary | ICD-10-CM

## 2020-10-22 DIAGNOSIS — E7800 Pure hypercholesterolemia, unspecified: Secondary | ICD-10-CM | POA: Diagnosis not present

## 2020-10-22 MED ORDER — LOSARTAN POTASSIUM 50 MG PO TABS: 50.0000 mg | ORAL_TABLET | Freq: Every day | ORAL | 3 refills | Status: DC

## 2020-10-22 NOTE — Patient Instructions (Addendum)
Medication Instructions:  Increase Losartan to 50 mg daily  *If you need a refill on your cardiac medications before your next appointment, please call your pharmacy*   Lab Work: BMP and Fasting Lipids in 3-4 weeks  If you have labs (blood work) drawn today and your tests are completely normal, you will receive your results only by: MyChart Message (if you have MyChart) OR A paper copy in the mail If you have any lab test that is abnormal or we need to change your treatment, we will call you to review the results.   Testing/Procedures: None Ordered   Follow-Up: At [facility], you and your health needs are our priority.  As part of our continuing mission to provide you with exceptional heart care, we have created designated Provider Care Teams.  These Care Teams include your primary Cardiologist (physician) and Advanced Practice Providers (APPs -  Physician Assistants and Nurse Practitioners) who all work together to provide you with the care you need, when you need it.  We recommend signing up for the patient portal called "MyChart".  Sign up information is provided on this After Visit Summary.  MyChart is used to connect with patients for Virtual Visits (Telemedicine).  Patients are able to view lab/test results, encounter notes, upcoming appointments, etc.  Non-urgent messages can be sent to your provider as well.   To learn more about what you can do with MyChart, go to [email or web address].    Your next appointment:   6 month(s)  The format for your next appointment:   In Person  Provider:   Jodelle Red, MD  Your physician recommends that you schedule a follow-up appointment in: 2-3 weeks with PharmD or Gillian Shields for blood pressure check

## 2020-10-23 ENCOUNTER — Encounter (HOSPITAL_BASED_OUTPATIENT_CLINIC_OR_DEPARTMENT_OTHER): Payer: Self-pay | Admitting: Cardiology

## 2020-11-07 ENCOUNTER — Other Ambulatory Visit: Payer: Self-pay

## 2020-11-07 ENCOUNTER — Ambulatory Visit (INDEPENDENT_AMBULATORY_CARE_PROVIDER_SITE_OTHER): Payer: Medicare Other | Admitting: Pharmacist

## 2020-11-07 DIAGNOSIS — I10 Essential (primary) hypertension: Secondary | ICD-10-CM | POA: Diagnosis not present

## 2020-11-07 DIAGNOSIS — E7800 Pure hypercholesterolemia, unspecified: Secondary | ICD-10-CM | POA: Diagnosis not present

## 2020-11-07 MED ORDER — AMLODIPINE BESYLATE 10 MG PO TABS: 10.0000 mg | ORAL_TABLET | Freq: Every day | ORAL | 1 refills | Status: DC

## 2020-11-07 MED ORDER — PRAVASTATIN SODIUM 20 MG PO TABS: 20.0000 mg | ORAL_TABLET | Freq: Every evening | ORAL | 1 refills | Status: DC

## 2020-11-07 NOTE — Patient Instructions (Addendum)
It was nice meeting you today!  We would like your blood pressure to be less than 130/80  Continue your losartan 50mg  daily Continue hydrochlorothiazide 25mg  daily Continue carvedilol 6.25mg  twice a day I will refill your amlodipine and pravastatin for you since you have been off them  Work on reducing your caffeine intake and salt  We will see you back in 1 month  Please call with any questions!  , PharmD, BCACP, CDCES, CPP [facility] [address], [place], [address] [place] Phone: [phone]; Fax: [phone] 11/07/2020 9:45 AM

## 2020-11-07 NOTE — Progress Notes (Signed)
Patient ID: Rachael Smith                 DOB: 1955-10-15                      MRN: [id number]     HPI: Rachael Smith is a 65 y.o. female referred by Dr. Cristal Deer to HTN clinic. PMH is significant for HLD and HTN.  Patient seen by Dr Cristal Deer on 10/22/20 and losartan was increased.  Patient has misplaced her amlodipine and pravastatin and has not taken it in 2 months.  Patient presents today in good spirits.  Reports anxiety at medical offices and the trips to medical offices.  Reports compliance with HCTZ, carvedilol, and losartan but did not know she could have requested a refill of her amlodipine.  Takes her BP at home and logs it but she forgot to bring it with her.  Reports readings are typically in the 140s-150s / 70s-80s.  Does not add salt to food but will purchase foods that already include salt such as hamburger helper.  Also likes spagetti and chicken.    Does not drink coffee but drinks 2 regular Cokes a day.  Her largest issues is her anxieties and stress which she reports get so severe she occasionally breaks out in hives.  Is not on any anti-anxiety medication.  Urticaria relieved by diphenhydramine.  Thinks she may be allergic to chocolate.   Does not use tobacco or alcohol  Current HTN meds: amlodipine 10mg  daily, carvedilol 6.25mg  BID, HCTZ 25mg  daily, losartan  BP goal: <130/80  Wt Readings from Last 3 Encounters:  10/22/20 111 lb 9.6 oz (50.6 kg)  08/27/20 111 lb 3.2 oz (50.4 kg)  10/12/19 111 lb (50.3 kg)   BP Readings from Last 3 Encounters:  10/22/20 (!) 156/82  08/27/20 (!) 187/100  10/12/19 130/70   Pulse Readings from Last 3 Encounters:  10/22/20 68  08/27/20 68  10/12/19 67    Renal function: CrCl cannot be calculated (Patient's most recent lab result is older than the maximum 21 days allowed.).  Past Medical History:  Diagnosis Date   Hypertension     Current Outpatient Medications on File Prior to Visit  Medication Sig Dispense  Refill   amLODipine (NORVASC) 10 MG tablet Take 1 tablet (10 mg total) by mouth daily. 90 tablet 3   carvedilol (COREG) 6.25 MG tablet Take 1 tablet (6.25 mg total) by mouth 2 (two) times daily with a meal. 180 tablet 3   diphenhydrAMINE (BENADRYL) 25 MG tablet Take 1 tablet (25 mg total) by mouth every 6 (six) hours. (Patient taking differently: Take 25 mg by mouth every 6 (six) hours as needed.) 20 tablet 0   hydrochlorothiazide (HYDRODIURIL) 25 MG tablet Take 1 tablet (25 mg total) by mouth daily. Take on tablet in the morning. 90 tablet 3   losartan (COZAAR) 50 MG tablet Take 1 tablet (50 mg total) by mouth daily. 90 tablet 3   nitroGLYCERIN (NITROSTAT) 0.4 MG SL tablet Place 1 tablet (0.4 mg total) under the tongue every 5 (five) minutes as needed for chest pain. 25 tablet 2   pravastatin (PRAVACHOL) 20 MG tablet Take 1 tablet (20 mg total) by mouth every evening. 90 tablet 3   No current facility-administered medications on file prior to visit.    Allergies  Allergen Reactions   Ibuprofen Swelling   Zyrtec [Cetirizine]     Mouth feels funny     Assessment/Plan:  1.  Hypertension -  Patient BP in room 148/86 which reduced to 128/94 after sitting for 30 minutes.  Both over goal of <130/80.  Patient may be able to reach goal by restarting amlodipine. Will send in refill.  Recommended patient reduce caffeine intake and reduce sodium consumption.  Patient voiced understanding.  Continue to check BP at home.  Continue losartan 50mg  daily Continue carvedilol 6.25mg  BID Continue HCTZ 25mg  daily Restart amlodipine 10mg  daily Restart pravastatin 20mg  daily Recheck in clinic in 4 weeks.   , PharmD, BCACP, CDCES, CPP [facility] [address] [place], Laural Golden [facility] Phone: [phone]; Fax: [phone] 11/07/2020 10:03 AM

## 2020-11-08 LAB — LIPID PANEL
Chol/HDL Ratio: 4 ratio (ref 0.0–4.4)
Cholesterol, Total: 242 mg/dL — ABNORMAL HIGH (ref 100–199)
HDL: 60 mg/dL (ref >39)
LDL Chol Calc (NIH): 161 mg/dL — ABNORMAL HIGH (ref 0–99)
Triglycerides: 119 mg/dL (ref 0–149)
VLDL Cholesterol Cal: 21 mg/dL (ref 5–40)

## 2020-11-08 LAB — BASIC METABOLIC PANEL
BUN/Creatinine Ratio: 16 (ref 12–28)
BUN: 12 mg/dL (ref 8–27)
CO2: 26 mmol/L (ref 20–29)
Calcium: 9.8 mg/dL (ref 8.7–10.3)
Chloride: 99 mmol/L (ref 96–106)
Creatinine, Ser: 0.75 mg/dL (ref 0.57–1.00)
Glucose: 87 mg/dL (ref 65–99)
Potassium: 3.9 mmol/L (ref 3.5–5.2)
Sodium: 141 mmol/L (ref 134–144)
eGFR: 88 mL/min/{1.73_m2} (ref >59)

## 2020-11-18 ENCOUNTER — Other Ambulatory Visit (INDEPENDENT_AMBULATORY_CARE_PROVIDER_SITE_OTHER): Payer: Self-pay | Admitting: Primary Care

## 2020-11-18 DIAGNOSIS — I10 Essential (primary) hypertension: Secondary | ICD-10-CM

## 2020-11-18 NOTE — Telephone Encounter (Signed)
Requested medication (s) are due for refill today:   Yes  Requested medication (s) are on the active medication list:   Yes  Future visit scheduled:   No   Last ordered: 10/12/2019 #90, 3 refills  Returned because prescribed by Jodelle Red at [facility].  Didn't know whether Gwinda Passe would be refilling this or Dr. Cristal Deer.   Requested Prescriptions  Pending Prescriptions Disp Refills   hydrochlorothiazide (HYDRODIURIL) 25 MG tablet [Pharmacy Med Name: HYDROCHLOROTHIAZIDE 25 MG TAB] 90 tablet 1    Sig: Take 1 tablet (25 mg total) by mouth daily. Take on tablet in the morning.     Cardiovascular: Diuretics - Thiazide Failed - 11/18/2020  1:31 AM      Failed - Last BP in normal range    BP Readings from Last 1 Encounters:  11/07/20 (!) 128/94          Passed - Ca in normal range and within 360 days    Calcium  Date Value Ref Range Status  11/07/2020 9.8 8.7 - 10.3 mg/dL Final          Passed - Cr in normal range and within 360 days    Creatinine, Ser  Date Value Ref Range Status  11/07/2020 0.75 0.57 - 1.00 mg/dL Final          Passed - K in normal range and within 360 days    Potassium  Date Value Ref Range Status  11/07/2020 3.9 3.5 - 5.2 mmol/L Final          Passed - Na in normal range and within 360 days    Sodium  Date Value Ref Range Status  11/07/2020 141 134 - 144 mmol/L Final          Passed - Valid encounter within last 6 months    Recent Outpatient Visits           2 months ago Bilateral impacted cerumen   [facility] Grayce Sessions, NP   2 months ago Essential hypertension   [facility] Grayce Sessions, NP   1 year ago Cervical cancer screening   [facility] Grayce Sessions, NP   1 year ago Essential hypertension   [facility] Grayce Sessions, NP   2 years ago Essential hypertension   [facility] Loletta Specter, PA-C       Future Appointments             In 3 weeks  [facility], [facility]   In 5 months Jodelle Red, MD [facility], [facility]

## 2020-11-19 ENCOUNTER — Other Ambulatory Visit: Payer: Self-pay | Admitting: Cardiology

## 2020-11-19 DIAGNOSIS — I10 Essential (primary) hypertension: Secondary | ICD-10-CM

## 2020-11-19 NOTE — Telephone Encounter (Signed)
Filled 10/22/20

## 2020-11-21 ENCOUNTER — Other Ambulatory Visit: Payer: Self-pay | Admitting: Primary Care

## 2020-11-21 ENCOUNTER — Other Ambulatory Visit: Payer: Self-pay | Admitting: Physician Assistant

## 2020-11-21 DIAGNOSIS — Z139 Encounter for screening, unspecified: Secondary | ICD-10-CM

## 2020-12-10 ENCOUNTER — Inpatient Hospital Stay: Admission: RE | Admit: 2020-12-10 | Payer: Medicare Other | Source: Ambulatory Visit

## 2020-12-11 ENCOUNTER — Ambulatory Visit: Payer: Medicare Other

## 2020-12-12 ENCOUNTER — Ambulatory Visit
Admission: RE | Admit: 2020-12-12 | Discharge: 2020-12-12 | Disposition: A | Discharge status: Home / Self Care | Payer: Medicare Other | Source: Ambulatory Visit | Attending: Primary Care | Admitting: Primary Care

## 2020-12-12 ENCOUNTER — Other Ambulatory Visit: Payer: Self-pay

## 2020-12-12 DIAGNOSIS — Z139 Encounter for screening, unspecified: Secondary | ICD-10-CM

## 2020-12-26 ENCOUNTER — Ambulatory Visit: Payer: Medicare Other | Admitting: Pharmacist Clinician (PhC)/ Clinical Pharmacy Specialist

## 2020-12-26 ENCOUNTER — Other Ambulatory Visit: Payer: Self-pay

## 2020-12-26 VITALS — BP 162/100 | HR 67 | Resp 16 | Ht 63.0 in | Wt 113.0 lb

## 2020-12-26 DIAGNOSIS — I10 Essential (primary) hypertension: Secondary | ICD-10-CM

## 2020-12-26 MED ORDER — VALSARTAN 160 MG PO TABS: 160.0000 mg | ORAL_TABLET | Freq: Every day | ORAL | 6 refills | Status: DC

## 2020-12-26 MED ORDER — CHLORTHALIDONE 25 MG PO TABS: 25.0000 mg | ORAL_TABLET | Freq: Every day | ORAL | 6 refills | Status: DC

## 2020-12-26 NOTE — Progress Notes (Signed)
Patient ID: Rachael Smith                 DOB: 04-Nov-1955                      MRN: [id number]     HPI: [name] is a 65 y.o. female referred by Dr. Cristal Deer to HTN clinic. PMH is significant for HLD and HTN.  Patient seen by Dr Cristal Deer on 10/22/20 and losartan was increased.  She has been seen several times in CVRR.  At her last visit with Laural Golden MD she has discontinued amlodipine, did not understand that she could refill.  That was re-started, along with pravastatin.  She continues to eat a diet inclusive of processed foods as well as high in sodium.  Also admits to high levels of anxiety at times, and can break out in hives.  She treats this with OTC diphenhydramine.    Today she notes that she does not drink alcohol or use tobacco.  Has given up Coke and is now drinking caffeine free [employer] or ginger ale.    Current HTN meds: amlodipine 10mg  daily, carvedilol 6.25mg  BID, HCTZ 25mg  daily, losartan 50 mg qd  BP goal: <130/80  Wt Readings from Last 3 Encounters:  12/26/20 113 lb (51.3 kg)  11/07/20 111 lb 9.6 oz (50.6 kg)  10/22/20 111 lb 9.6 oz (50.6 kg)   BP Readings from Last 3 Encounters:  12/26/20 (!) 162/100  11/07/20 (!) 128/94  10/22/20 (!) 156/82   Pulse Readings from Last 3 Encounters:  12/26/20 67  11/07/20 78  10/22/20 68    Renal function: CrCl cannot be calculated (Patient's most recent lab result is older than the maximum 21 days allowed.).  Past Medical History:  Diagnosis Date   Hypertension     Current Outpatient Medications on File Prior to Visit  Medication Sig Dispense Refill   amLODipine (NORVASC) 10 MG tablet Take 1 tablet (10 mg total) by mouth daily. 90 tablet 1   carvedilol (COREG) 6.25 MG tablet Take 1 tablet (6.25 mg total) by mouth 2 (two) times daily with a meal. 180 tablet 3   diphenhydrAMINE (BENADRYL) 25 MG tablet Take 1 tablet (25 mg total) by mouth every 6 (six) hours. (Patient taking differently: Take 25 mg by  mouth every 6 (six) hours as needed.) 20 tablet 0   nitroGLYCERIN (NITROSTAT) 0.4 MG SL tablet Place 1 tablet (0.4 mg total) under the tongue every 5 (five) minutes as needed for chest pain. 25 tablet 2   pravastatin (PRAVACHOL) 20 MG tablet Take 1 tablet (20 mg total) by mouth every evening. 90 tablet 1   No current facility-administered medications on file prior to visit.    Allergies  Allergen Reactions   Ibuprofen Swelling   Zyrtec [Cetirizine]     Mouth feels funny    BP 162/100 HR 67  Assessment/Plan:  1. Hypertension -   Patient with essential hypertension, with both systolic and diastolic elevations.  Will discontinue losartan and hydrochlorothiazide, and replace them with valsartan 160 mg and chlorthalidone 25 mg.  She will need to continue amlodipine and carvedilol.  Repeat metabolic panel in 2 weeks.  We will see her back in one month for follow up.    11/09/20 PharmD CPP [facility]  [facility]  12/30/2020 3:46 PM

## 2020-12-26 NOTE — Patient Instructions (Addendum)
Return for a a follow up appointment November 1 at 9 am  Go to the lab in 2 weeks to check kidney function (week of October 17)  Take your BP meds as follows:  STOP HYDROCHLOROTHIAZIDE AND LOSARTAN  START CHLORTHALIDONE 25 MG ONCE DAILY IN THE MORNINGS  START VALSARTAN 160 MG ONCE DAILY IN THE EVENINGS    CONTINUE WITH ALL OTHER MEDICATIONS  Bring all of your meds, your BP cuff and your record of home blood pressures to your next appointment.  Exercise as you're able, try to walk approximately 30 minutes per day.  Keep salt intake to a minimum, especially watch canned and prepared boxed foods.  Eat more fresh fruits and vegetables and fewer canned items.  Avoid eating in fast food restaurants.    HOW TO TAKE YOUR BLOOD PRESSURE: Rest 5 minutes before taking your blood pressure.  Don't smoke or drink caffeinated beverages for at least 30 minutes before. Take your blood pressure before (not after) you eat. Sit comfortably with your back supported and both feet on the floor (don't cross your legs). Elevate your arm to heart level on a table or a desk. Use the proper sized cuff. It should fit smoothly and snugly around your bare upper arm. There should be enough room to slip a fingertip under the cuff. The bottom edge of the cuff should be 1 inch above the crease of the elbow. Ideally, take 3 measurements at one sitting and record the average.

## 2020-12-30 ENCOUNTER — Encounter: Payer: Self-pay | Admitting: Pharmacist Clinician (PhC)/ Clinical Pharmacy Specialist

## 2020-12-30 NOTE — Assessment & Plan Note (Addendum)
Patient with essential hypertension, with both systolic and diastolic elevations.  Will discontinue losartan and hydrochlorothiazide, and replace them with valsartan 160 mg and chlorthalidone 25 mg.  She will need to continue amlodipine and carvedilol.  Repeat metabolic panel in 2 weeks.  We will see her back in one month for follow up.

## 2021-01-10 LAB — BASIC METABOLIC PANEL
BUN/Creatinine Ratio: 25 (ref 12–28)
BUN: 17 mg/dL (ref 8–27)
CO2: 25 mmol/L (ref 20–29)
Calcium: 9.8 mg/dL (ref 8.7–10.3)
Chloride: 101 mmol/L (ref 96–106)
Creatinine, Ser: 0.67 mg/dL (ref 0.57–1.00)
Glucose: 95 mg/dL (ref 70–99)
Potassium: 3.7 mmol/L (ref 3.5–5.2)
Sodium: 141 mmol/L (ref 134–144)
eGFR: 96 mL/min/{1.73_m2} (ref >59)

## 2021-01-20 ENCOUNTER — Telehealth: Payer: Self-pay | Admitting: Cardiology

## 2021-01-20 NOTE — Telephone Encounter (Signed)
Spoke to patient lab results given. 

## 2021-01-20 NOTE — Telephone Encounter (Signed)
Pt is returning call back to [facility] from Friday

## 2021-01-21 ENCOUNTER — Ambulatory Visit: Payer: Medicare Other

## 2021-01-24 ENCOUNTER — Other Ambulatory Visit: Payer: Self-pay

## 2021-01-24 ENCOUNTER — Ambulatory Visit: Payer: Medicare Other | Admitting: Pharmacist

## 2021-01-24 VITALS — BP 102/72 | Wt 111.0 lb

## 2021-01-24 DIAGNOSIS — I10 Essential (primary) hypertension: Secondary | ICD-10-CM | POA: Diagnosis not present

## 2021-01-24 DIAGNOSIS — R0789 Other chest pain: Secondary | ICD-10-CM | POA: Diagnosis not present

## 2021-01-24 MED ORDER — NITROGLYCERIN 0.4 MG SL SUBL: 0.4000 mg | SUBLINGUAL_TABLET | SUBLINGUAL | 2 refills | Status: AC | PRN

## 2021-01-24 NOTE — Progress Notes (Signed)
Patient ID: Rachael Smith                 DOB: 02-Aug-1955                      MRN: [id number]     HPI: Rachael Smith is a 65 y.o. female referred by Dr. Cristal Deer to HTN clinic. PMH is significant for HTN and HLD. Patient seen by Dr Cristal Deer on 10/22/20 and losartan was increased.  She has been seen several times in [facility].  At her last visit with Laural Golden MD she has discontinued amlodipine, did not understand that she could refill.  That was re-started, along with pravastatin.  She continues to eat a diet inclusive of processed foods as well as high in sodium.  Also admits to high levels of anxiety at times, and can break out in hives.  She treats this with OTC diphenhydramine.  At last visit chlorthalidone and valsartan were started (losartan and HCTZ d/c) and patient was instrcuted to bring in her BP cuff.  Patient presents today in good spirits.  Brought 3 blood pressure cuffs.  Has 2 wrist cuffs and 1 upper arm cuff however upper arm cuff batteries are dead.  Has been using one wrist cuff.  In room readings: Wrist cuff #1: 107/71 Wrist cuff #2:  110/65 Upper arm cuff: no reading, batteries dead  Patient demonstrated proper technique for using cuffs.  Reports medication compliance with no adverse effects.  Current HTN meds:   chlorthalidone 25mg  daily,  valsartan 160mg  daily, amlodipine 10mg  daily,  carvedilol 6.25mg  BID  Previously tried:  losaryan 50mg ,  HCTZ 25mg   BP goal: <130/80  Wt Readings from Last 3 Encounters:  12/26/20 113 lb (51.3 kg)  11/07/20 111 lb 9.6 oz (50.6 kg)  10/22/20 111 lb 9.6 oz (50.6 kg)   BP Readings from Last 3 Encounters:  12/26/20 (!) 162/100  11/07/20 (!) 128/94  10/22/20 (!) 156/82   Pulse Readings from Last 3 Encounters:  12/26/20 67  11/07/20 78  10/22/20 68    Renal function: CrCl cannot be calculated (Unknown ideal weight.).  Past Medical History:  Diagnosis Date   Hypertension     Current Outpatient  Medications on File Prior to Visit  Medication Sig Dispense Refill   amLODipine (NORVASC) 10 MG tablet Take 1 tablet (10 mg total) by mouth daily. 90 tablet 1   carvedilol (COREG) 6.25 MG tablet Take 1 tablet (6.25 mg total) by mouth 2 (two) times daily with a meal. 180 tablet 3   chlorthalidone (HYGROTON) 25 MG tablet Take 1 tablet (25 mg total) by mouth daily. 30 tablet 6   diphenhydrAMINE (BENADRYL) 25 MG tablet Take 1 tablet (25 mg total) by mouth every 6 (six) hours. (Patient taking differently: Take 25 mg by mouth every 6 (six) hours as needed.) 20 tablet 0   nitroGLYCERIN (NITROSTAT) 0.4 MG SL tablet Place 1 tablet (0.4 mg total) under the tongue every 5 (five) minutes as needed for chest pain. 25 tablet 2   pravastatin (PRAVACHOL) 20 MG tablet Take 1 tablet (20 mg total) by mouth every evening. 90 tablet 1   valsartan (DIOVAN) 160 MG tablet Take 1 tablet (160 mg total) by mouth daily. 30 tablet 6   No current facility-administered medications on file prior to visit.    Allergies  Allergen Reactions   Ibuprofen Swelling   Zyrtec [Cetirizine]     Mouth feels funny     Assessment/Plan:  1.  Hypertension -  Patient BP in room 102/72 which is at goal of <130/80 and similar to home cuff.  Home cuff appears accurate.  No medication changes needed at this time  Instructed patient to continue current medications and continue to monitor at home.    Patient nitroglycerin expired.  Will refill.  Continue: chlorthalidone 25mg  daily,  valsartan 160mg  daily, amlodipine 10mg  daily,  carvedilol 6.25mg  BID Recheck as needed  , PharmD, BCACP, CDCES, CPP [address], [address] [place], Laural Golden, [address] Phone: [phone], Fax: [phone]

## 2021-01-24 NOTE — Patient Instructions (Addendum)
It was nice seeing you again!  We would like to keep your blood pressure less than 130/80.  Continue to monitor at home.  Your blood pressure today looks great!  Continue: Chlorthalidone 25mg  daily,  Valsartan 160mg  daily,  Amlodipine 10mg  daily,  Carvedilol 6.25mg  twice a day  Please give a call with any questions  , PharmD, BCACP, CDCES, CPP [address] [place], Laural Golden, [address] Phone: [phone], Fax: [phone]

## 2021-04-25 NOTE — Progress Notes (Signed)
Cardiology Office Note:    Date:  04/28/2021   ID:  Rachael Smith, DOB 09-20-1955, MRN [id number]  PCP:  Grayce Sessions, NP  Cardiologist:  Jodelle Red, MD PhD  Referring MD: Grayce Sessions, NP   CC: follow up  History of Present Illness:    Rachael Smith is a 66 y.o. female with a hx of hypertension who is seen in follow up. Initial consult note 04/19/18.   Cardiac history: seen initially for chest pain. ETT negative. Followed up for elevated BP in HTN clinic and with me 06/2018.   She followed up with our pharmacist 01/24/2021 and her blood pressure was 102/72, similar to home cuff. No medication changes.  Today: Overall, she is feeling good, with no new concerns. For exercise she has been walking more often, and spending time with her grandchild. She denies any exertional symptoms.  She denies any palpitations, chest pain, or shortness of breath. No lightheadedness, headaches, syncope, orthopnea, PND, lower extremity edema.   Past Medical History:  Diagnosis Date   Hypertension     Past Surgical History:  Procedure Laterality Date   ABDOMINAL HYSTERECTOMY      Current Medications: Current Outpatient Medications on File Prior to Visit  Medication Sig   amLODipine (NORVASC) 10 MG tablet Take 1 tablet (10 mg total) by mouth daily.   chlorthalidone (HYGROTON) 25 MG tablet Take 1 tablet (25 mg total) by mouth daily.   diphenhydrAMINE (BENADRYL) 25 MG tablet Take 1 tablet (25 mg total) by mouth every 6 (six) hours. (Patient taking differently: Take 25 mg by mouth every 6 (six) hours as needed.)   nitroGLYCERIN (NITROSTAT) 0.4 MG SL tablet Place 1 tablet (0.4 mg total) under the tongue every 5 (five) minutes as needed for chest pain.   pravastatin (PRAVACHOL) 20 MG tablet Take 1 tablet (20 mg total) by mouth every evening.   valsartan (DIOVAN) 160 MG tablet Take 1 tablet (160 mg total) by mouth daily.   No current facility-administered medications on  file prior to visit.     Allergies:   Ibuprofen and Zyrtec [cetirizine]   Social History   Tobacco Use   Smoking status: Never   Smokeless tobacco: Never  Vaping Use   Vaping Use: Never used  Substance Use Topics   Alcohol use: No   Drug use: No    Family History: father had MI, not sure of age. No other MI or CVA, no other heart disease  ROS:   Please see the history of present illness.   Additional pertinent ROS otherwise unremarkable.  EKGs/Labs/Other Studies Reviewed:    The following studies were reviewed today:  ETT 05/11/18: normal Blood pressure demonstrated a normal response to exercise. There was no ST segment deviation noted during stress. No chest pain during stress Normal exercise capacity, test stopped for fatigue and shortness of breath No ischemia on ECG.  EKG:  EKG is personally reviewed.   04/28/2021: EKG was not ordered. 10/22/2020: NSR at 68 bpm 10/12/2019: normal sinus rhythm at 67 bpm  Recent Labs: 01/10/2021: BUN 17; Creatinine, Ser 0.67; Potassium 3.7; Sodium 141   Recent Lipid Panel    Component Value Date/Time   CHOL 242 (H) 11/07/2020 1036   TRIG 119 11/07/2020 1036   HDL 60 11/07/2020 1036   CHOLHDL 4.0 11/07/2020 1036   LDLCALC 161 (H) 11/07/2020 1036    Physical Exam:    VS:  BP 126/62    Pulse 70    Ht 5\' 3"  (  1.6 m)    Wt 114 lb 14.4 oz (52.1 kg)    SpO2 98%    BMI 20.35 kg/m     Wt Readings from Last 3 Encounters:  04/28/21 114 lb 14.4 oz (52.1 kg)  01/24/21 111 lb (50.3 kg)  12/26/20 113 lb (51.3 kg)    GEN: Well nourished, well developed in no acute distress HEENT: Normal, moist mucous membranes NECK: No JVD CARDIAC: regular rhythm, normal S1 and S2, no rubs or gallops. No murmur. VASCULAR: Radial and DP pulses 2+ bilaterally. No carotid bruits RESPIRATORY:  Clear to auscultation without rales, wheezing or rhonchi  ABDOMEN: Soft, non-tender, non-distended MUSCULOSKELETAL:  Ambulates independently SKIN: Warm and dry, no  edema NEUROLOGIC:  Alert and oriented x 3. No focal neuro deficits noted. PSYCHIATRIC:  Normal affect   ASSESSMENT:    1. Pure hypercholesterolemia   2. Essential hypertension   3. Cardiac risk counseling   4. Counseling on health promotion and disease prevention      PLAN:    Hypertension: at goal, best control I have seen -continue amlodipine 10 mg daily -continue carvedilol 6.25 mg BID -continue chlorthalidone 25 mg daily -continue valsartan 160 mg daily  Hypercholesterolemia: -on pravastatin 20 mg. We have discussed intensifying based on her lipid results, she would prefer to stay on current regimen  Primary prevention and CV risk counseling -recommend heart healthy/Mediterranean diet, with whole grains, fruits, vegetable, fish, lean meats, nuts, and olive oil. Limit salt. -recommend moderate walking, 3-5 times/week for 30-50 minutes each session. Aim for at least 150 minutes.week. Goal should be pace of 3 miles/hours, or walking 1.5 miles in 30 minutes -recommend avoidance of tobacco products. Avoid excess alcohol. -ASCVD risk score: The 10-year ASCVD risk score (Arnett DK, et al., 2019) is: 20.6%   Values used to calculate the score:     Age: 81 years     Sex: Female     Is Non-Hispanic African American: Yes     Diabetic: No     Tobacco smoker: Yes     Systolic Blood Pressure: 126 mmHg     Is BP treated: Yes     HDL Cholesterol: 60 mg/dL     Total Cholesterol: 242 mg/dL   Plan for follow up: 1 year or sooner PRN  Medication Adjustments/Labs and Tests Ordered: Current medicines are reviewed at length with the patient today.  Concerns regarding medicines are outlined above.   Patient Instructions  Medication Instructions:  Your Physician recommend you continue on your current medication as directed.    *If you need a refill on your cardiac medications before your next appointment, please call your pharmacy*   Lab Work: None ordered  today   Testing/Procedures: None ordered today   Follow-Up: At [facility], you and your health needs are our priority.  As part of our continuing mission to provide you with exceptional heart care, we have created designated Provider Care Teams.  These Care Teams include your primary Cardiologist (physician) and Advanced Practice Providers (APPs -  Physician Assistants and Nurse Practitioners) who all work together to provide you with the care you need, when you need it.  We recommend signing up for the patient portal called "MyChart".  Sign up information is provided on this After Visit Summary.  MyChart is used to connect with patients for Virtual Visits (Telemedicine).  Patients are able to view lab/test results, encounter notes, upcoming appointments, etc.  Non-urgent messages can be sent to your provider as well.  To learn more about what you can do with MyChart, go to [email or web address].    Your next appointment:   1 year(s)  The format for your next appointment:   In Person  Provider:   Jodelle RedBridgette Gleason Ardoin, MD       [facility],Mathew Stumpf,acting as a scribe for Jodelle RedBridgette Satine Hausner, MD.,have documented all relevant documentation on the behalf of Jodelle RedBridgette Blanca Thornton, MD,as directed by  Jodelle RedBridgette Ezmeralda Stefanick, MD while in the presence of Jodelle RedBridgette Mikeila Burgen, MD.  I, Jodelle RedBridgette Darrek Leasure, MD, have reviewed all documentation for this visit. The documentation on 04/28/21 for the exam, diagnosis, procedures, and orders are all accurate and complete.   Signed, Jodelle RedBridgette Bunnie Lederman, MD PhD 04/28/2021     [facility]

## 2021-04-28 ENCOUNTER — Other Ambulatory Visit: Payer: Self-pay

## 2021-04-28 ENCOUNTER — Ambulatory Visit (HOSPITAL_BASED_OUTPATIENT_CLINIC_OR_DEPARTMENT_OTHER): Payer: Medicare Other | Admitting: Cardiology

## 2021-04-28 ENCOUNTER — Encounter (HOSPITAL_BASED_OUTPATIENT_CLINIC_OR_DEPARTMENT_OTHER): Payer: Self-pay | Admitting: Cardiology

## 2021-04-28 VITALS — BP 126/62 | HR 70 | Ht 63.0 in | Wt 114.9 lb

## 2021-04-28 DIAGNOSIS — I10 Essential (primary) hypertension: Secondary | ICD-10-CM

## 2021-04-28 DIAGNOSIS — E7800 Pure hypercholesterolemia, unspecified: Secondary | ICD-10-CM

## 2021-04-28 DIAGNOSIS — Z7189 Other specified counseling: Secondary | ICD-10-CM

## 2021-04-28 MED ORDER — CARVEDILOL 6.25 MG PO TABS: 6.2500 mg | ORAL_TABLET | Freq: Two times a day (BID) | ORAL | 3 refills | Status: DC

## 2021-04-28 NOTE — Patient Instructions (Signed)

## 2021-06-20 ENCOUNTER — Other Ambulatory Visit: Payer: Self-pay | Admitting: Cardiology

## 2021-06-20 DIAGNOSIS — E7800 Pure hypercholesterolemia, unspecified: Secondary | ICD-10-CM

## 2021-06-20 NOTE — Telephone Encounter (Signed)
Rx(s) sent to pharmacy electronically.  

## 2022-01-29 ENCOUNTER — Other Ambulatory Visit: Payer: Self-pay | Admitting: Primary Care

## 2022-01-29 DIAGNOSIS — Z1231 Encounter for screening mammogram for malignant neoplasm of breast: Secondary | ICD-10-CM

## 2022-02-20 ENCOUNTER — Ambulatory Visit
Admission: RE | Admit: 2022-02-20 | Discharge: 2022-02-20 | Disposition: A | Discharge status: Home / Self Care | Payer: Medicare Other | Source: Ambulatory Visit | Attending: Primary Care | Admitting: Primary Care

## 2022-02-20 DIAGNOSIS — Z1231 Encounter for screening mammogram for malignant neoplasm of breast: Secondary | ICD-10-CM

## 2022-02-27 ENCOUNTER — Ambulatory Visit (INDEPENDENT_AMBULATORY_CARE_PROVIDER_SITE_OTHER): Payer: Medicare Other

## 2022-02-27 ENCOUNTER — Encounter (INDEPENDENT_AMBULATORY_CARE_PROVIDER_SITE_OTHER): Payer: Self-pay

## 2022-02-27 DIAGNOSIS — Z0000 Encounter for general adult medical examination without abnormal findings: Secondary | ICD-10-CM

## 2022-02-27 NOTE — Progress Notes (Signed)
Subjective:   Rachael Smith is a 66 y.o. female who presents for an Initial Medicare Annual Wellness Visit.  I connected with  Hardie Shackleton on 02/27/22 by a audio enabled telemedicine application and verified that I am speaking with the correct person using two identifiers.  Patient Location: Home  Provider Location: Office/Clinic  I discussed the limitations of evaluation and management by telemedicine. The patient expressed understanding and agreed to proceed.       Objective:    There were no vitals filed for this visit. There is no height or weight on file to calculate BMI.     04/12/2018    4:20 PM 11/02/2016   10:45 AM 10/12/2016   10:50 AM 07/24/2016    3:30 AM  Advanced Directives  Does Patient Have a Medical Advance Directive? No No No No  Would patient like information on creating a medical advance directive? No - Patient declined No - Patient declined No - Patient declined No - Patient declined    Current Medications (verified) Outpatient Encounter Medications as of 02/27/2022  Medication Sig   amLODipine (NORVASC) 10 MG tablet Take 1 tablet (10 mg total) by mouth daily.   carvedilol (COREG) 6.25 MG tablet Take 1 tablet (6.25 mg total) by mouth 2 (two) times daily with a meal.   chlorthalidone (HYGROTON) 25 MG tablet Take 1 tablet (25 mg total) by mouth daily.   diphenhydrAMINE (BENADRYL) 25 MG tablet Take 1 tablet (25 mg total) by mouth every 6 (six) hours. (Patient taking differently: Take 25 mg by mouth every 6 (six) hours as needed.)   nitroGLYCERIN (NITROSTAT) 0.4 MG SL tablet Place 1 tablet (0.4 mg total) under the tongue every 5 (five) minutes as needed for chest pain.   pravastatin (PRAVACHOL) 20 MG tablet TAKE 1 TABLET BY MOUTH EVERY DAY IN THE EVENING   valsartan (DIOVAN) 160 MG tablet TAKE 1 TABLET BY MOUTH EVERY DAY   No facility-administered encounter medications on file as of 02/27/2022.    Allergies (verified) Ibuprofen and Zyrtec  [cetirizine]   History: Past Medical History:  Diagnosis Date   Hypertension    Past Surgical History:  Procedure Laterality Date   ABDOMINAL HYSTERECTOMY     Family History  Problem Relation Age of Onset   Breast cancer Neg Hx    Social History   Socioeconomic History   Marital status: Single    Spouse name: Not on file   Number of children: Not on file   Years of education: Not on file   Highest education level: Not on file  Occupational History   Not on file  Tobacco Use   Smoking status: Never   Smokeless tobacco: Never  Vaping Use   Vaping Use: Never used  Substance and Sexual Activity   Alcohol use: No   Drug use: No   Sexual activity: Not on file  Other Topics Concern   Not on file  Social History Narrative   Not on file   Social Determinants of Health   Financial Resource Strain: Not on file  Food Insecurity: Not on file  Transportation Needs: Not on file  Physical Activity: Not on file  Stress: Not on file  Social Connections: Not on file    Tobacco Counseling Counseling given: Not Answered   Clinical Intake:                 Diabetic?no         Activities of Daily Living  No data to display           Patient Care Team: Grayce Sessions, NP as PCP - General (Internal Medicine) Jodelle Red, MD as PCP - Cardiology (Cardiology)  Indicate any recent Medical Services you may have received from other than [facility] providers in the past year (date may be approximate).     Assessment:   This is a routine wellness examination for Rachael Smith.  Hearing/Vision screen No results found.  Dietary issues and exercise activities discussed:     Goals Addressed   None   Depression Screen    08/27/2020    4:29 PM 09/19/2019    9:16 AM 08/29/2019    2:34 PM 03/21/2018    9:37 AM 03/08/2018    2:22 PM 02/21/2018    8:57 AM 01/31/2018    9:19 AM  PHQ 2/9 Scores  PHQ - 2 Score 6 3 4 4 3 4 3   PHQ- 9 Score 19 14 15 8 13  14 9     Fall Risk    09/19/2019    9:11 AM 08/29/2019    2:34 PM 03/21/2018    9:28 AM 02/21/2018    8:57 AM 01/31/2018    9:20 AM  Fall Risk   Falls in the past year? 0 0 0 0 0  Follow up Falls evaluation completed        FALL RISK PREVENTION PERTAINING TO THE HOME:  Any stairs in or around the home? Yes  If so, are there any without handrails? No  Home free of loose throw rugs in walkways, pet beds, electrical cords, etc? Yes  Adequate lighting in your home to reduce risk of falls? Yes   ASSISTIVE DEVICES UTILIZED TO PREVENT FALLS:  Life alert? No  Use of a cane, walker or w/c? No  Grab bars in the bathroom? No  Shower chair or bench in shower? No  Elevated toilet seat or a handicapped toilet? No   Cognitive Function:        Immunizations Immunization History  Administered Date(s) Administered   Influenza,inj,Quad PF,6+ Mos 12/20/2017, 01/13/2019   PFIZER Comirnaty(Gray Top)Covid-19 Tri-Sucrose Vaccine 07/23/2019, 08/13/2019   PFIZER(Purple Top)SARS-COV-2 Vaccination 02/03/2020   Tdap 11/30/2016    TDAP status: Up to date  Flu Vaccine status: Due, Education has been provided regarding the importance of this vaccine. Advised may receive this vaccine at local pharmacy or Health Dept. Aware to provide a copy of the vaccination record if obtained from local pharmacy or Health Dept. Verbalized acceptance and understanding.  Pneumococcal vaccine status: Due, Education has been provided regarding the importance of this vaccine. Advised may receive this vaccine at local pharmacy or Health Dept. Aware to provide a copy of the vaccination record if obtained from local pharmacy or Health Dept. Verbalized acceptance and understanding.  Covid-19 vaccine status: Information provided on how to obtain vaccines.   Qualifies for Shingles Vaccine? Yes   Zostavax completed No   Shingrix Completed?: No.    Education has been provided regarding the importance of this vaccine. Patient  has been advised to call insurance company to determine out of pocket expense if they have not yet received this vaccine. Advised may also receive vaccine at local pharmacy or Health Dept. Verbalized acceptance and understanding.  Screening Tests Health Maintenance  Topic Date Due   Zoster Vaccines- Shingrix (1 of 2) Never done   Pneumonia Vaccine 82+ Years old (1 - PCV) Never done   DEXA SCAN  Never done  Fecal DNA (Cologuard)  02/23/2021   INFLUENZA VACCINE  10/21/2021   COVID-19 Vaccine (4 - 2023-24 season) 11/21/2021   Medicare Annual Wellness (AWV)  02/28/2023   MAMMOGRAM  02/21/2024   DTaP/Tdap/Td (2 - Td or Tdap) 12/01/2026   Hepatitis C Screening  Completed   HPV VACCINES  Aged Out    Health Maintenance  Health Maintenance Due  Topic Date Due   Zoster Vaccines- Shingrix (1 of 2) Never done   Pneumonia Vaccine 76+ Years old (1 - PCV) Never done   DEXA SCAN  Never done   Fecal DNA (Cologuard)  02/23/2021   INFLUENZA VACCINE  10/21/2021   COVID-19 Vaccine (4 - 2023-24 season) 11/21/2021    Colorectal cancer screening: Type of screening: Cologuard. Completed 02/23/2018. Repeat every 3 years  Mammogram status: Completed 02/20/2022. Repeat every year  Bone Density status: Ordered  . Pt provided with contact info and advised to call to schedule appt.  Lung Cancer Screening: (Low Dose CT Chest recommended if Age 49-80 years, 30 pack-year currently smoking OR have quit w/in 15years.) does not qualify.   Lung Cancer Screening Referral: na  Additional Screening:  Hepatitis C Screening: does qualify; Completed yes  Vision Screening: Recommended annual ophthalmology exams for early detection of glaucoma and other disorders of the eye. Is the patient up to date with their annual eye exam?  No  Who is the provider or what is the name of the office in which the patient attends annual eye exams? [facility] If pt is not established with a provider, would they like to be referred to  a provider to establish care?  na .   Dental Screening: Recommended annual dental exams for proper oral hygiene  Community Resource Referral / Chronic Care Management: CRR required this visit?  No   CCM required this visit?  No      Plan:     I have personally reviewed and noted the following in the patient's chart:   Medical and social history Use of alcohol, tobacco or illicit drugs  Current medications and supplements including opioid prescriptions. Patient is not currently taking opioid prescriptions. Functional ability and status Nutritional status Physical activity Advanced directives List of other physicians Hospitalizations, surgeries, and ER visits in previous 12 months Vitals Screenings to include cognitive, depression, and falls Referrals and appointments  In addition, I have reviewed and discussed with patient certain preventive protocols, quality metrics, and best practice recommendations. A written personalized care plan for preventive services as well as general preventive health recommendations were provided to patient.     Rachael Smith   02/27/2022   Nurse Notes:  Rachael Smith , Thank you for taking time to come for your Medicare Wellness Visit. I appreciate your ongoing commitment to your health goals. Please review the following plan we discussed and let me know if I can assist you in the future.   These are the goals we discussed:  Goals      Blood Pressure < 130/80        This is a list of the screening recommended for you and due dates:  Health Maintenance  Topic Date Due   Zoster (Shingles) Vaccine (1 of 2) Never done   Pneumonia Vaccine (1 - PCV) Never done   DEXA scan (bone density measurement)  Never done   Cologuard (Stool DNA test)  02/23/2021   Flu Shot  10/21/2021   COVID-19 Vaccine (4 - 2023-24 season) 11/21/2021   Medicare Annual Wellness Visit  02/28/2023   Mammogram  02/21/2024   DTaP/Tdap/Td vaccine (2 - Td or Tdap)  12/01/2026   Hepatitis C Screening: USPSTF Recommendation to screen - Ages 60-79 yo.  Completed   HPV Vaccine  Aged Out

## 2022-02-27 NOTE — Patient Instructions (Signed)

## 2022-03-04 ENCOUNTER — Ambulatory Visit (INDEPENDENT_AMBULATORY_CARE_PROVIDER_SITE_OTHER): Payer: Medicare Other | Admitting: Primary Care

## 2022-03-04 ENCOUNTER — Encounter (INDEPENDENT_AMBULATORY_CARE_PROVIDER_SITE_OTHER): Payer: Self-pay | Admitting: Primary Care

## 2022-03-04 VITALS — BP 142/94 | HR 66 | Resp 16 | Ht 62.0 in | Wt 109.2 lb

## 2022-03-04 DIAGNOSIS — Z1211 Encounter for screening for malignant neoplasm of colon: Secondary | ICD-10-CM

## 2022-03-04 DIAGNOSIS — Z23 Encounter for immunization: Secondary | ICD-10-CM

## 2022-03-04 DIAGNOSIS — I10 Essential (primary) hypertension: Secondary | ICD-10-CM

## 2022-03-04 DIAGNOSIS — E2839 Other primary ovarian failure: Secondary | ICD-10-CM

## 2022-03-04 DIAGNOSIS — Z0000 Encounter for general adult medical examination without abnormal findings: Secondary | ICD-10-CM

## 2022-03-04 DIAGNOSIS — Z0001 Encounter for general adult medical examination with abnormal findings: Secondary | ICD-10-CM | POA: Diagnosis not present

## 2022-03-04 DIAGNOSIS — Z79899 Other long term (current) drug therapy: Secondary | ICD-10-CM | POA: Diagnosis not present

## 2022-03-04 MED ORDER — ZOSTER VAC RECOMB ADJUVANTED 50 MCG/0.5ML IM SUSR: 0.5000 mL | Freq: Once | INTRAMUSCULAR | 1 refills | Status: AC

## 2022-03-05 LAB — CMP14+EGFR
ALT: 12 IU/L (ref 0–32)
AST: 18 IU/L (ref 0–40)
Albumin/Globulin Ratio: 1.4 (ref 1.2–2.2)
Albumin: 4.5 g/dL (ref 3.9–4.9)
Alkaline Phosphatase: 103 IU/L (ref 44–121)
BUN/Creatinine Ratio: 24 (ref 12–28)
BUN: 16 mg/dL (ref 8–27)
Bilirubin Total: 0.3 mg/dL (ref 0.0–1.2)
CO2: 27 mmol/L (ref 20–29)
Calcium: 9.6 mg/dL (ref 8.7–10.3)
Chloride: 98 mmol/L (ref 96–106)
Creatinine, Ser: 0.66 mg/dL (ref 0.57–1.00)
Globulin, Total: 3.2 g/dL (ref 1.5–4.5)
Glucose: 89 mg/dL (ref 70–99)
Potassium: 3.7 mmol/L (ref 3.5–5.2)
Sodium: 139 mmol/L (ref 134–144)
Total Protein: 7.7 g/dL (ref 6.0–8.5)
eGFR: 96 mL/min/{1.73_m2} (ref >59)

## 2022-03-05 LAB — CBC WITH DIFFERENTIAL/PLATELET
Basophils Absolute: 0 10*3/uL (ref 0.0–0.2)
Basos: 1 %
EOS (ABSOLUTE): 0.3 10*3/uL (ref 0.0–0.4)
Eos: 5 %
Hematocrit: 37 % (ref 34.0–46.6)
Hemoglobin: 12.1 g/dL (ref 11.1–15.9)
Immature Grans (Abs): 0 10*3/uL (ref 0.0–0.1)
Immature Granulocytes: 0 %
Lymphocytes Absolute: 2.7 10*3/uL (ref 0.7–3.1)
Lymphs: 42 %
MCH: 27.9 pg (ref 26.6–33.0)
MCHC: 32.7 g/dL (ref 31.5–35.7)
MCV: 85 fL (ref 79–97)
Monocytes Absolute: 0.7 10*3/uL (ref 0.1–0.9)
Monocytes: 11 %
Neutrophils Absolute: 2.7 10*3/uL (ref 1.4–7.0)
Neutrophils: 41 %
Platelets: 359 10*3/uL (ref 150–450)
RBC: 4.34 x10E6/uL (ref 3.77–5.28)
RDW: 12.6 % (ref 11.7–15.4)
WBC: 6.4 10*3/uL (ref 3.4–10.8)

## 2022-03-05 LAB — LIPID PANEL
Chol/HDL Ratio: 3.7 ratio (ref 0.0–4.4)
Cholesterol, Total: 247 mg/dL — ABNORMAL HIGH (ref 100–199)
HDL: 66 mg/dL (ref >39)
LDL Chol Calc (NIH): 169 mg/dL — ABNORMAL HIGH (ref 0–99)
Triglycerides: 72 mg/dL (ref 0–149)
VLDL Cholesterol Cal: 12 mg/dL (ref 5–40)

## 2022-03-08 NOTE — Progress Notes (Signed)
Blood Pressure (Abnormal) 142/94   Pulse 66   Respiration 16   Height _0  (1.575 m)   Weight 109 lb 3.2 oz (49.5 kg)   Oxygen Saturation 97%   Body Mass Index 19.97 kg/m    Subjective:    Patient ID: Rachael Smith, female    DOB: 10/18/1955, 66 y.o.   MRN: [id number]  HPI: Rachael Smith is a 66 y.o. female presenting on 03/04/2022 for comprehensive medical examination. Current medical complaints include:none  She currently lives with: Menopausal Symptoms: yes  Depression Screen done today and results listed below:    Row Labels 03/04/2022   10:11 AM 02/27/2022   12:04 PM 08/27/2020    4:29 PM 09/19/2019    9:16 AM 08/29/2019    2:34 PM  Depression screen PHQ 2/9   Section Header. No data exists in this row.       Decreased Interest   1 0 _1 Down, Depressed, Hopeless   1 0 _2 PHQ - 2 Score   2 0 _3 Altered sleeping   _4 Tired, decreased energy   _5 Change in appetite   1  2 0 0  Feeling bad or failure about yourself    0  _6 Trouble concentrating   _7 Moving slowly or fidgety/restless   0  _8 Suicidal thoughts   0  0 0 0  PHQ-9 Score   _9 Difficult doing work/chores     Very difficult  Somewhat difficult    The patient does not have a history of falls. I did not complete a risk assessment for falls. A plan of care for falls was not documented.   Past Medical History:  Past Medical History:  Diagnosis Date   Hypertension     Surgical History:  Past Surgical History:  Procedure Laterality Date   ABDOMINAL HYSTERECTOMY      Medications:  Current Outpatient Medications on File Prior to Visit  Medication Sig   amLODipine (NORVASC) 10 MG tablet Take 1 tablet (10 mg total) by mouth daily.   carvedilol (COREG) 6.25 MG tablet Take 1 tablet (6.25 mg total) by mouth 2 (two) times daily with a meal.   chlorthalidone (HYGROTON) 25 MG tablet Take 1 tablet (25 mg total) by mouth daily.   diphenhydrAMINE (BENADRYL)  25 MG tablet Take 1 tablet (25 mg total) by mouth every 6 (six) hours. (Patient taking differently: Take 25 mg by mouth every 6 (six) hours as needed.)   nitroGLYCERIN (NITROSTAT) 0.4 MG SL tablet Place 1 tablet (0.4 mg total) under the tongue every 5 (five) minutes as needed for chest pain.   pravastatin (PRAVACHOL) 20 MG tablet TAKE 1 TABLET BY MOUTH EVERY DAY IN THE EVENING   valsartan (DIOVAN) 160 MG tablet TAKE 1 TABLET BY MOUTH EVERY DAY   No current facility-administered medications on file prior to visit.    Allergies:  Allergies  Allergen Reactions   Ibuprofen Swelling   Zyrtec [Cetirizine]     Mouth feels funny    Social History:  Social History   Socioeconomic History   Marital status: Single    Spouse name: Not on file   Number of children: Not on file   Years of education: Not on file   Highest education level: Not on file  Occupational History   Not on file  Tobacco Use   Smoking status: Never   Smokeless tobacco: Never  Vaping Use   Vaping Use: Never used  Substance and Sexual Activity   Alcohol use: No   Drug use: No   Sexual activity: Not on file  Other Topics Concern   Not on file  Social History Narrative   Not on file   Social Determinants of Health   Financial Resource Strain: Low Risk  (02/27/2022)   Overall Financial Resource Strain (CARDIA)    Difficulty of Paying Living Expenses: Not hard at all  Food Insecurity: No Food Insecurity (02/27/2022)   Hunger Vital Sign    Worried About Running Out of Food in the Last Year: Never true    Ran Out of Food in the Last Year: Never true  Transportation Needs: No Transportation Needs (02/27/2022)   PRAPARE - Hydrologist (Medical): No    Lack of Transportation (Non-Medical): No  Physical Activity: Insufficiently Active (02/27/2022)   Exercise Vital Sign    Days of Exercise per Week: 7 days    Minutes of Exercise per Session: 20 min  Stress: No Stress Concern Present  (02/27/2022)   [place]    Feeling of Stress : Not at all  Social Connections: Moderately Integrated (02/27/2022)   Social Connection and Isolation Panel [NHANES]    Frequency of Communication with Friends and Family: More than three times a week    Frequency of Social Gatherings with Friends and Family: Three times a week    Attends Religious Services: More than 4 times per year    Active Member of Clubs or Organizations: Yes    Attends Archivist Meetings: More than 4 times per year    Marital Status: Never married  Intimate Partner Violence: Not At Risk (02/27/2022)   Humiliation, Afraid, Rape, and Kick questionnaire    Fear of Current or Ex-Partner: No    Emotionally Abused: No    Physically Abused: No    Sexually Abused: No   Social History   Tobacco Use  Smoking Status Never  Smokeless Tobacco Never   Social History   Substance and Sexual Activity  Alcohol Use No    Family History:  Family History  Problem Relation Age of Onset   Breast cancer Neg Hx     Past medical history, surgical history, medications, allergies, family history and social history reviewed with patient today and changes made to appropriate areas of the chart.   Review of Systems - History obtained from the patient All other ROS negative except what is listed above and in the HPI.      Objective:    Blood Pressure (Abnormal) 142/94   Pulse 66   Respiration 16   Height _0  (1.575 m)   Weight 109 lb 3.2 oz (49.5 kg)   Oxygen Saturation 97%   Body Mass Index 19.97 kg/m   Wt Readings from Last 3 Encounters:  03/04/22 109 lb 3.2 oz (49.5 kg)  04/28/21 114 lb 14.4 oz (52.1 kg)  01/24/21 111 lb (50.3 kg)    Physical Exam General: No apparent distress. Eyes: Extraocular eye movements intact, pupils equal and round. Neck: Supple, trachea midline. Thyroid: No enlargement, mobile without fixation, no  tenderness. Cardiovascular: Regular rhythm and rate, no murmur, normal radial pulses. Respiratory: Normal respiratory effort, clear to auscultation. Gastrointestinal: Normal pitch active bowel sounds, nontender abdomen  without distention or appreciable hepatomegaly. Musculoskeletal: Normal muscle tone, no tenderness on palpation of tibia, no excessive thoracic kyphosis. Skin: Appropriate warmth, no visible rash. Mental status: Alert, conversant, speech clear, thought logical, appropriate mood and affect, no hallucinations or delusions evident. Hematologic/lymphatic: No cervical adenopathy, no visible ecchymoses.  Results for orders placed or performed in visit on 03/04/22  CMP14+EGFR  Result Value Ref Range   Glucose 89 70 - 99 mg/dL   BUN 16 8 - 27 mg/dL   Creatinine, Ser 0.66 0.57 - 1.00 mg/dL   eGFR 96 >59 mL/min/1.73   BUN/Creatinine Ratio 24 12 - 28   Sodium 139 134 - 144 mmol/L   Potassium 3.7 3.5 - 5.2 mmol/L   Chloride 98 96 - 106 mmol/L   CO2 27 20 - 29 mmol/L   Calcium 9.6 8.7 - 10.3 mg/dL   Total Protein 7.7 6.0 - 8.5 g/dL   Albumin 4.5 3.9 - 4.9 g/dL   Globulin, Total 3.2 1.5 - 4.5 g/dL   Albumin/Globulin Ratio 1.4 1.2 - 2.2   Bilirubin Total 0.3 0.0 - 1.2 mg/dL   Alkaline Phosphatase 103 44 - 121 IU/L   AST 18 0 - 40 IU/L   ALT 12 0 - 32 IU/L  Lipid Panel  Result Value Ref Range   Cholesterol, Total 247 (H) 100 - 199 mg/dL   Triglycerides 72 0 - 149 mg/dL   HDL 66 >39 mg/dL   VLDL Cholesterol Cal 12 5 - 40 mg/dL   LDL Chol Calc (NIH) 169 (H) 0 - 99 mg/dL   Chol/HDL Ratio 3.7 0.0 - 4.4 ratio  CBC with Differential  Result Value Ref Range   WBC 6.4 3.4 - 10.8 x10E3/uL   RBC 4.34 3.77 - 5.28 x10E6/uL   Hemoglobin 12.1 11.1 - 15.9 g/dL   Hematocrit 37.0 34.0 - 46.6 %   MCV 85 79 - 97 fL   MCH 27.9 26.6 - 33.0 pg   MCHC 32.7 31.5 - 35.7 g/dL   RDW 12.6 11.7 - 15.4 %   Platelets 359 150 - 450 x10E3/uL   Neutrophils 41 Not Estab. %   Lymphs 42 Not Estab. %    Monocytes 11 Not Estab. %   Eos 5 Not Estab. %   Basos 1 Not Estab. %   Neutrophils Absolute 2.7 1.4 - 7.0 x10E3/uL   Lymphocytes Absolute 2.7 0.7 - 3.1 x10E3/uL   Monocytes Absolute 0.7 0.1 - 0.9 x10E3/uL   EOS (ABSOLUTE) 0.3 0.0 - 0.4 x10E3/uL   Basophils Absolute 0.0 0.0 - 0.2 x10E3/uL   Immature Granulocytes 0 Not Estab. %   Immature Grans (Abs) 0.0 0.0 - 0.1 x10E3/uL      Assessment & Plan:   Problem List Items Addressed This Visit     Essential hypertension   Relevant Orders   CMP14+EGFR (Completed)   Other Visit Diagnoses     Need for immunization against influenza    -  Primary   Relevant Orders   Flu Vaccine QUAD High Dose(Fluad) (Completed)   Annual physical exam       Estrogen deficiency       Relevant Orders   DG Bone Density   Medication management       Relevant Orders   Lipid Panel (Completed)   CBC with Differential (Completed)   Colon cancer screening       Relevant Orders   Cologuard   Routine general medical examination at a health care facility  Follow up plan: Return in about 3 months (around 06/03/2022) for hyperlipidemia .   LABORATORY TESTING:  - Pap smear: up to date  IMMUNIZATIONS:   - Tdap: Tetanus vaccination status reviewed: last tetanus booster within 10 years. - Influenza: Up to date - Pneumovax: Refused - Prevnar: Refused - HPV: Not applicable - Zostavax vaccine: Refused  SCREENING: -Mammogram: Ordered today  - Colonoscopy: Ordered today  - Bone Density: Ordered today  -Hearing Test: Refused  -Spirometry: Refused   PATIENT COUNSELING:   Advised to take 1 mg of folate supplement per day if capable of pregnancy.   Sexuality: Discussed sexually transmitted diseases, partner selection, use of condoms, avoidance of unintended pregnancy  and contraceptive alternatives.   Advised to avoid cigarette smoking.  I discussed with the patient that most people either abstain from alcohol or drink within safe limits  (<=14/week and <=4 drinks/occasion for males, <=7/weeks and <= 3 drinks/occasion for females) and that the risk for alcohol disorders and other health effects rises proportionally with the number of drinks per week and how often a drinker exceeds daily limits.  Discussed cessation/primary prevention of drug use and availability of treatment for abuse.   Diet: Encouraged to adjust caloric intake to maintain  or achieve ideal body weight, to reduce intake of dietary saturated fat and total fat, to limit sodium intake by avoiding high sodium foods and not adding table salt, and to maintain adequate dietary potassium and calcium preferably from fresh fruits, vegetables, and low-fat dairy products.    stressed the importance of regular exercise  Injury prevention: Discussed safety belts, safety helmets, smoke detector, smoking near bedding or upholstery.   Dental health: Discussed importance of regular tooth brushing, flossing, and dental visits.    NEXT PREVENTATIVE PHYSICAL DUE IN 1 YEAR. Return in about 3 months (around 06/03/2022) for hyperlipidemia .

## 2022-04-06 ENCOUNTER — Telehealth: Payer: Self-pay | Admitting: Cardiology

## 2022-04-06 DIAGNOSIS — E7800 Pure hypercholesterolemia, unspecified: Secondary | ICD-10-CM

## 2022-04-06 DIAGNOSIS — I10 Essential (primary) hypertension: Secondary | ICD-10-CM

## 2022-04-06 NOTE — Telephone Encounter (Signed)
*  STAT* If patient is at the pharmacy, call can be transferred to refill team.   1. Which medications need to be refilled? (please list name of each medication and dose if known)   amLODipine (NORVASC) 10 MG tablet  carvedilol (COREG) 6.25 MG tablet  chlorthalidone (HYGROTON) 25 MG tablet  pravastatin (PRAVACHOL) 20 MG tablet   valsartan (DIOVAN) 160 MG tablet    2. Which pharmacy/location (including street and city if local pharmacy) is medication to be sent to?  [id number] - [place], [place] - [address]    3. Do they need a 30 day or 90 day supply? 90 days

## 2022-04-07 MED ORDER — VALSARTAN 160 MG PO TABS: 160.0000 mg | ORAL_TABLET | Freq: Every day | ORAL | 1 refills | Status: DC

## 2022-04-07 MED ORDER — AMLODIPINE BESYLATE 10 MG PO TABS: 10.0000 mg | ORAL_TABLET | Freq: Every day | ORAL | 1 refills | Status: DC

## 2022-04-07 MED ORDER — CARVEDILOL 6.25 MG PO TABS: 6.2500 mg | ORAL_TABLET | Freq: Two times a day (BID) | ORAL | 1 refills | Status: DC

## 2022-04-07 MED ORDER — CHLORTHALIDONE 25 MG PO TABS: 25.0000 mg | ORAL_TABLET | Freq: Every day | ORAL | 1 refills | Status: DC

## 2022-04-07 MED ORDER — PRAVASTATIN SODIUM 20 MG PO TABS: 20.0000 mg | ORAL_TABLET | Freq: Every evening | ORAL | 1 refills | Status: DC

## 2022-04-07 NOTE — Telephone Encounter (Signed)
Rx request sent to pharmacy.  

## 2022-04-30 ENCOUNTER — Ambulatory Visit
Admission: RE | Admit: 2022-04-30 | Discharge: 2022-04-30 | Disposition: A | Discharge status: Home / Self Care | Payer: Medicare Other | Source: Ambulatory Visit | Attending: Primary Care | Admitting: Primary Care

## 2022-04-30 DIAGNOSIS — E2839 Other primary ovarian failure: Secondary | ICD-10-CM

## 2022-05-07 ENCOUNTER — Other Ambulatory Visit (INDEPENDENT_AMBULATORY_CARE_PROVIDER_SITE_OTHER): Payer: Self-pay | Admitting: Primary Care

## 2022-05-07 ENCOUNTER — Encounter (HOSPITAL_BASED_OUTPATIENT_CLINIC_OR_DEPARTMENT_OTHER): Payer: Self-pay | Admitting: Cardiology

## 2022-05-07 ENCOUNTER — Ambulatory Visit (HOSPITAL_BASED_OUTPATIENT_CLINIC_OR_DEPARTMENT_OTHER): Payer: Medicare Other | Admitting: Cardiology

## 2022-05-07 VITALS — BP 116/80 | HR 62 | Ht 62.0 in | Wt 110.9 lb

## 2022-05-07 DIAGNOSIS — I10 Essential (primary) hypertension: Secondary | ICD-10-CM | POA: Diagnosis not present

## 2022-05-07 DIAGNOSIS — Z7189 Other specified counseling: Secondary | ICD-10-CM

## 2022-05-07 DIAGNOSIS — R0789 Other chest pain: Secondary | ICD-10-CM

## 2022-05-07 DIAGNOSIS — E7800 Pure hypercholesterolemia, unspecified: Secondary | ICD-10-CM | POA: Diagnosis not present

## 2022-05-07 DIAGNOSIS — M810 Age-related osteoporosis without current pathological fracture: Secondary | ICD-10-CM

## 2022-05-07 NOTE — Patient Instructions (Signed)
Medication Instructions:  Your Physician recommend you continue on your current medication as directed.    *If you need a refill on your cardiac medications before your next appointment, please call your pharmacy*  Follow-Up: At [facility], you and your health needs are our priority.  As part of our continuing mission to provide you with exceptional heart care, we have created designated Provider Care Teams.  These Care Teams include your primary Cardiologist (physician) and Advanced Practice Providers (APPs -  Physician Assistants and Nurse Practitioners) who all work together to provide you with the care you need, when you need it.  We recommend signing up for the patient portal called "MyChart".  Sign up information is provided on this After Visit Summary.  MyChart is used to connect with patients for Virtual Visits (Telemedicine).  Patients are able to view lab/test results, encounter notes, upcoming appointments, etc.  Non-urgent messages can be sent to your provider as well.   To learn more about what you can do with MyChart, go to [email or web address].    Your next appointment:   1 year(s)  Provider:   Buford Dresser, MD

## 2022-05-07 NOTE — Progress Notes (Signed)
Cardiology Office Note:    Date:  05/07/2022   ID:  Nancie Hembree, DOB [birth year], MRN [id number]  PCP:  Kerin Perna, NP  Cardiologist:  Buford Dresser, MD PhD  Referring MD: Kerin Perna, NP   CC: follow up  History of Present Illness:    Rachael Smith is a 67 y.o. female with a hx of hypertension who is seen in follow up. Initial consult note 04/19/18.   Cardiac history: seen initially for chest pain. ETT negative. Followed up for elevated BP in HTN clinic and with me 06/2018.   She followed up with our pharmacist 01/24/2021 and her blood pressure was 102/72, similar to home cuff. No medication changes.   Today, the patient states that she has been doing ok. Her blood pressure has been well controlled recently. She has had no bad SOB or chest pain, she is not limited with daily living and feels that her breathing has improved.  She denies any palpitations, chest pain, shortness of breath, or peripheral edema. No lightheadedness, headaches, syncope, orthopnea, or PND.   Past Medical History:  Diagnosis Date   Hypertension     Past Surgical History:  Procedure Laterality Date   ABDOMINAL HYSTERECTOMY      Current Medications: Current Outpatient Medications on File Prior to Visit  Medication Sig   amLODipine (NORVASC) 10 MG tablet Take 1 tablet (10 mg total) by mouth daily.   carvedilol (COREG) 6.25 MG tablet Take 1 tablet (6.25 mg total) by mouth 2 (two) times daily with a meal.   chlorthalidone (HYGROTON) 25 MG tablet Take 1 tablet (25 mg total) by mouth daily.   diphenhydrAMINE (BENADRYL) 25 MG tablet Take 1 tablet (25 mg total) by mouth every 6 (six) hours. (Patient taking differently: Take 25 mg by mouth every 6 (six) hours as needed.)   nitroGLYCERIN (NITROSTAT) 0.4 MG SL tablet Place 1 tablet (0.4 mg total) under the tongue every 5 (five) minutes as needed for chest pain.   pravastatin (PRAVACHOL) 20 MG tablet Take 1 tablet (20 mg total) by  mouth every evening.   valsartan (DIOVAN) 160 MG tablet Take 1 tablet (160 mg total) by mouth daily.   No current facility-administered medications on file prior to visit.     Allergies:   Ibuprofen and Zyrtec [cetirizine]   Social History   Tobacco Use   Smoking status: Never   Smokeless tobacco: Never  Vaping Use   Vaping Use: Never used  Substance Use Topics   Alcohol use: No   Drug use: No    Family History: father had MI, not sure of age. No other MI or CVA, no other heart disease  ROS:   Please see the history of present illness.    Additional pertinent ROS otherwise unremarkable.  EKGs/Labs/Other Studies Reviewed:    The following studies were reviewed today:  ETT 05/11/18: normal Blood pressure demonstrated a normal response to exercise. There was no ST segment deviation noted during stress. No chest pain during stress Normal exercise capacity, test stopped for fatigue and shortness of breath No ischemia on ECG.  EKG:  EKG is personally reviewed.  05/07/2022: NSR heart rate 62 bpm 04/28/2021: EKG was not ordered. 10/22/2020: NSR at 68 bpm 10/12/2019: normal sinus rhythm at 67 bpm  Recent Labs: 03/04/2022: ALT 12; BUN 16; Creatinine, Ser 0.66; Hemoglobin 12.1; Platelets 359; Potassium 3.7; Sodium 139   Recent Lipid Panel    Component Value Date/Time   CHOL 247 (H) 03/04/2022 1101  TRIG 72 03/04/2022 1101   HDL 66 03/04/2022 1101   CHOLHDL 3.7 03/04/2022 1101   LDLCALC 169 (H) 03/04/2022 1101    Physical Exam:    VS:  BP 116/80 (BP Location: Left Arm, Patient Position: Sitting, Cuff Size: Normal)   Pulse 62   Ht 5' 2"$  (1.575 m)   Wt 110 lb 14.4 oz (50.3 kg)   BMI 20.28 kg/m     Wt Readings from Last 3 Encounters:  05/07/22 110 lb 14.4 oz (50.3 kg)  03/04/22 109 lb 3.2 oz (49.5 kg)  04/28/21 114 lb 14.4 oz (52.1 kg)    GEN: Well nourished, well developed in no acute distress HEENT: Normal, moist mucous membranes NECK: No JVD CARDIAC: regular  rhythm, normal S1 and S2, no rubs or gallops. No murmur. VASCULAR: Radial and DP pulses 2+ bilaterally. No carotid bruits RESPIRATORY:  Clear to auscultation without rales, wheezing or rhonchi  ABDOMEN: Soft, non-tender, non-distended MUSCULOSKELETAL:  Ambulates independently SKIN: Warm and dry, no edema NEUROLOGIC:  Alert and oriented x 3. No focal neuro deficits noted. PSYCHIATRIC:  Normal affect   ASSESSMENT:    1. Pure hypercholesterolemia   2. Essential hypertension   3. Other chest pain   4. Cardiac risk counseling     PLAN:    Hypertension: at goal, meds reviewed and tolerating well. -continue amlodipine 10 mg daily -continue carvedilol 6.25 mg BID -continue chlorthalidone 25 mg daily -continue valsartan 160 mg daily  Hypercholesterolemia: -on pravastatin 20 mg. Last LDL 169, not at goal. We have discussed intensifying based on her lipid results, she would prefer to stay on current regimen  Primary prevention and CV risk counseling -recommend heart healthy/Mediterranean diet, with whole grains, fruits, vegetable, fish, lean meats, nuts, and olive oil. Limit salt. -recommend moderate walking, 3-5 times/week for 30-50 minutes each session. Aim for at least 150 minutes.week. Goal should be pace of 3 miles/hours, or walking 1.5 miles in 30 minutes -recommend avoidance of tobacco products. Avoid excess alcohol. -ASCVD risk score: The 10-year ASCVD risk score (Arnett DK, et al., 2019) is: 9.8%   Values used to calculate the score:     Age: 62 years     Sex: Female     Is Non-Hispanic African American: Yes     Diabetic: No     Tobacco smoker: No     Systolic Blood Pressure: 99991111 mmHg     Is BP treated: Yes     HDL Cholesterol: 66 mg/dL     Total Cholesterol: 247 mg/dL   Plan for follow up: 1 year or sooner PRN  Medication Adjustments/Labs and Tests Ordered: Current medicines are reviewed at length with the patient today.  Concerns regarding medicines are outlined above.    Patient Instructions  Medication Instructions:  Your Physician recommend you continue on your current medication as directed.    *If you need a refill on your cardiac medications before your next appointment, please call your pharmacy*  Follow-Up: At [facility], you and your health needs are our priority.  As part of our continuing mission to provide you with exceptional heart care, we have created designated Provider Care Teams.  These Care Teams include your primary Cardiologist (physician) and Advanced Practice Providers (APPs -  Physician Assistants and Nurse Practitioners) who all work together to provide you with the care you need, when you need it.  We recommend signing up for the patient portal called "MyChart".  Sign up information is provided on this After  Visit Summary.  MyChart is used to connect with patients for Virtual Visits (Telemedicine).  Patients are able to view lab/test results, encounter notes, upcoming appointments, etc.  Non-urgent messages can be sent to your provider as well.   To learn more about what you can do with MyChart, go to [email or web address].    Your next appointment:   1 year(s)  Provider:   Buford Dresser, MD      I,Coren O'Brien,acting as a scribe for Buford Dresser, MD.,have documented all relevant documentation on the behalf of Buford Dresser, MD,as directed by  Buford Dresser, MD while in the presence of Buford Dresser, MD.  I, Buford Dresser, MD, have reviewed all documentation for this visit. The documentation on 05/07/22 for the exam, diagnosis, procedures, and orders are all accurate and complete.

## 2022-05-13 ENCOUNTER — Encounter (INDEPENDENT_AMBULATORY_CARE_PROVIDER_SITE_OTHER): Payer: Self-pay

## 2022-05-31 IMAGING — MG MM DIGITAL SCREENING BILAT W/ TOMO AND CAD
8 series · 8 of 24 positions shown · non-contrast
Comparison: Previous exam(s).

CLINICAL DATA: Screening.

EXAM:
DIGITAL SCREENING BILATERAL MAMMOGRAM WITH TOMOSYNTHESIS AND CAD
TECHNIQUE: Bilateral screening digital craniocaudal and mediolateral oblique
mammograms were obtained. Bilateral screening digital breast
tomosynthesis was performed. The images were evaluated with
computer-aided detection.

[L MLO synth-2D]
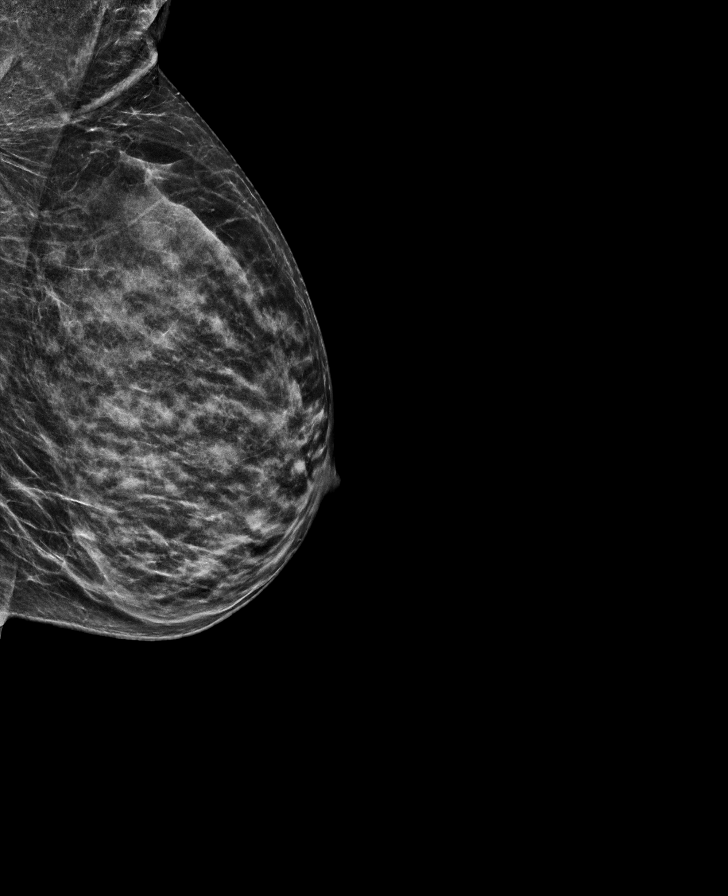

[R MLO synth-2D]
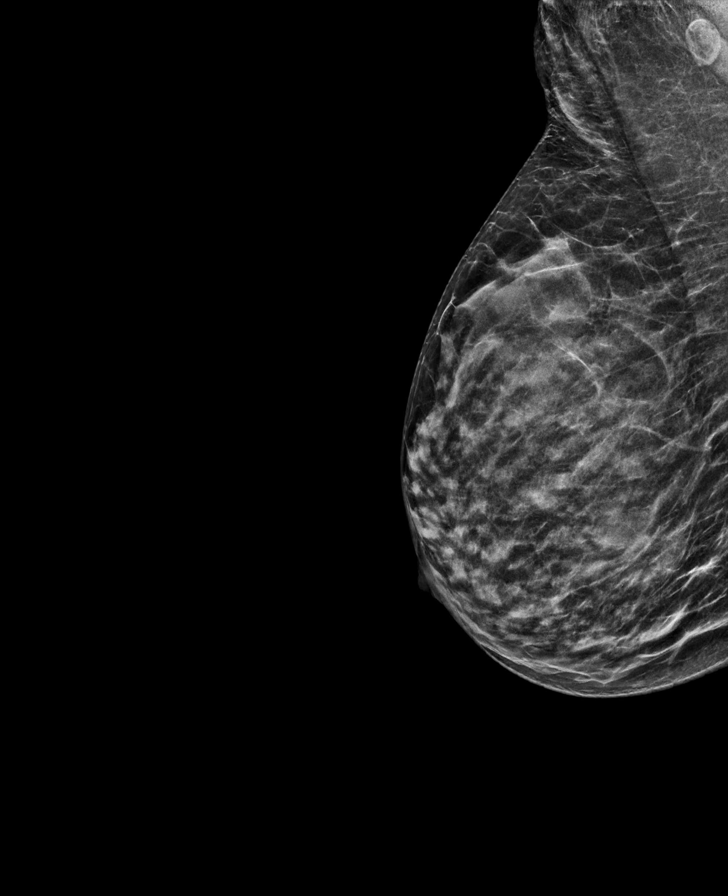

[L CC synth-2D]
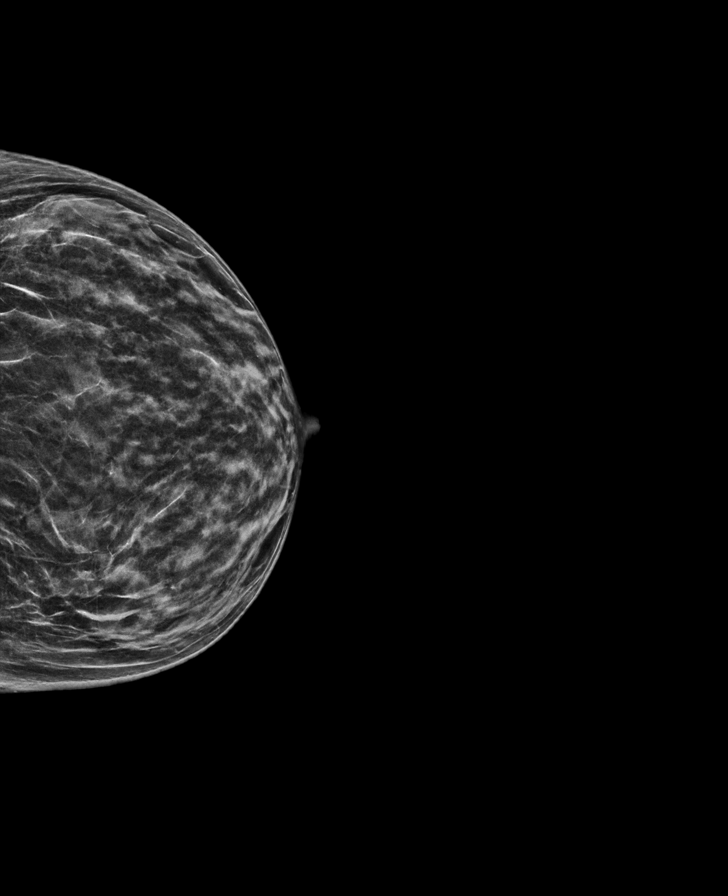

[R CC synth-2D]
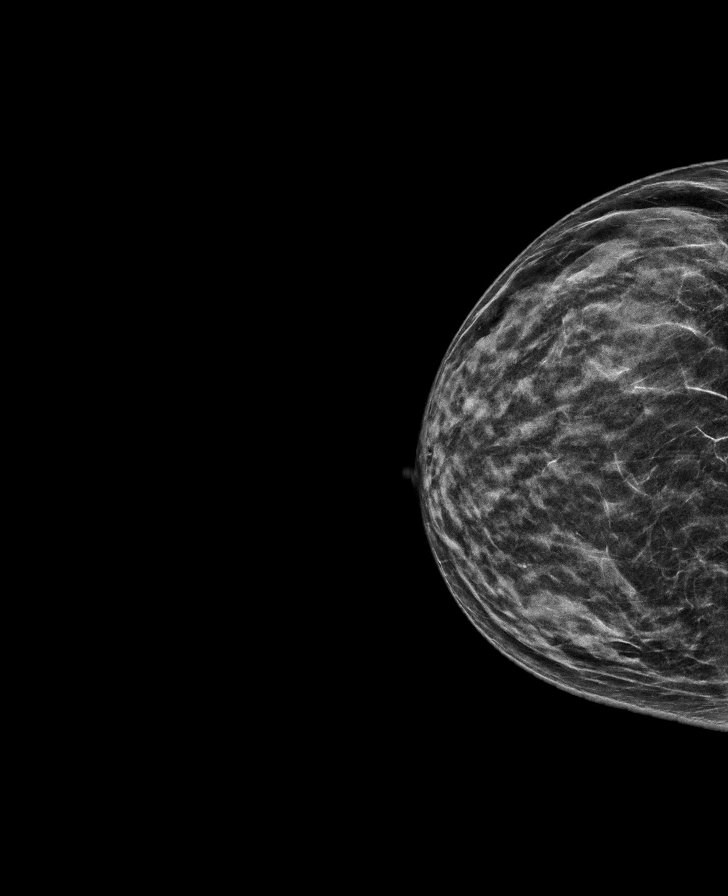

[L MLO tomo · tomo slice 26/51.0]
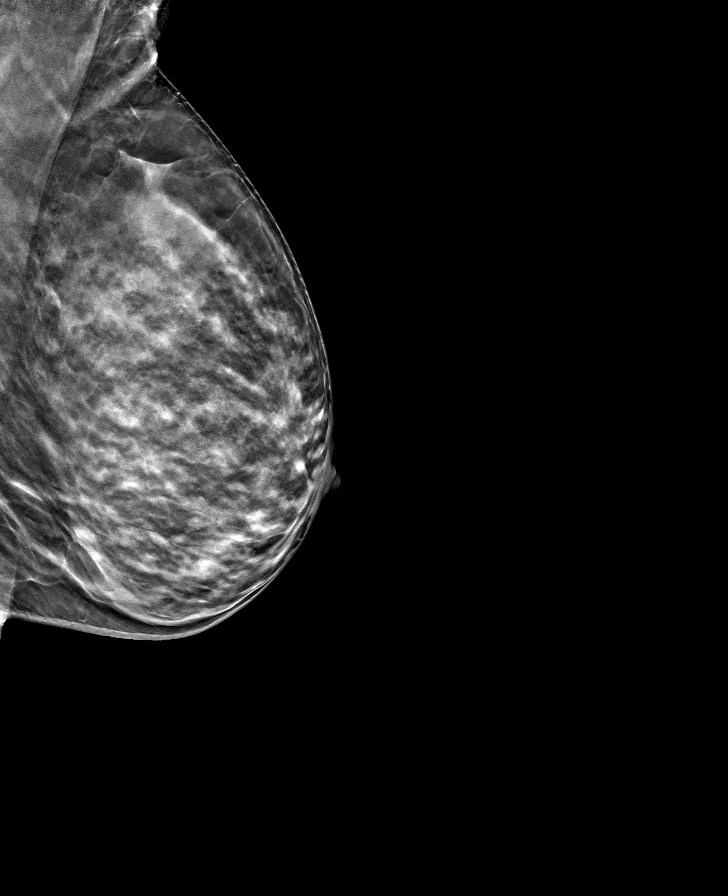

[L CC tomo · tomo slice 27/52.0]
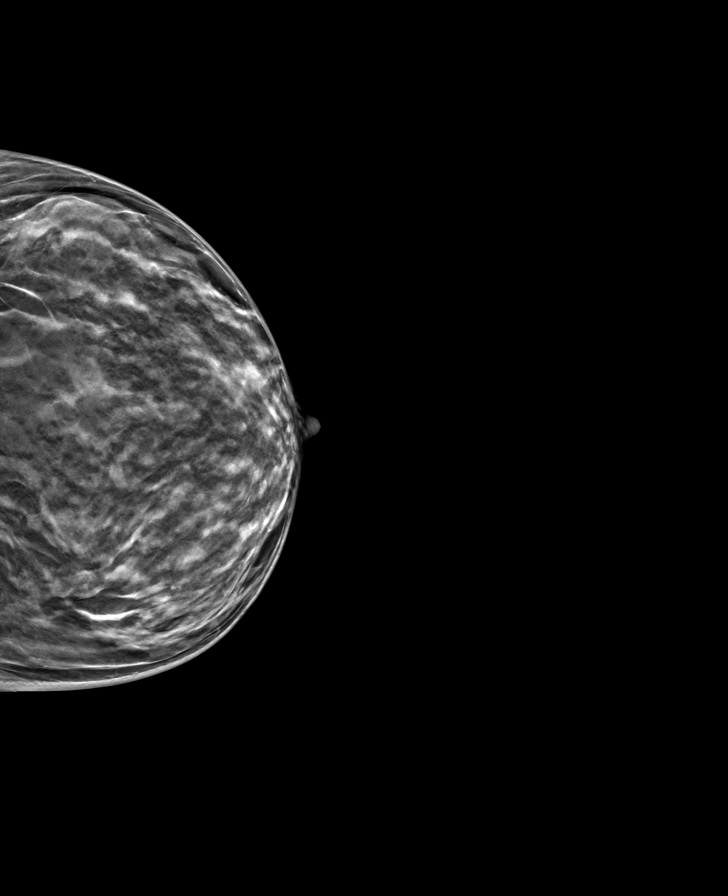

[R CC tomo · tomo slice 27/53.0]
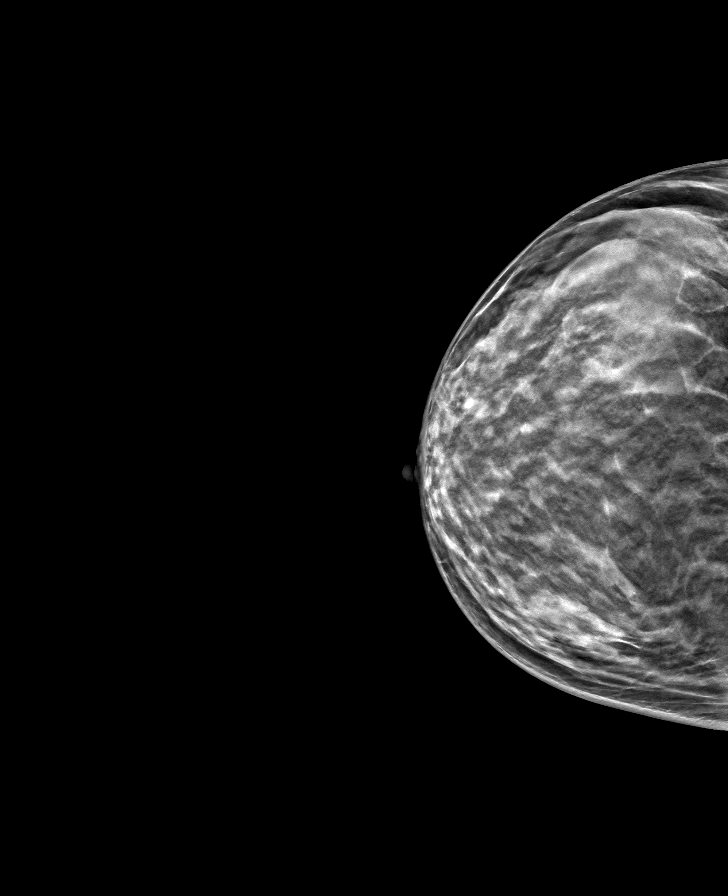

[R MLO tomo · tomo slice 26/51.0]
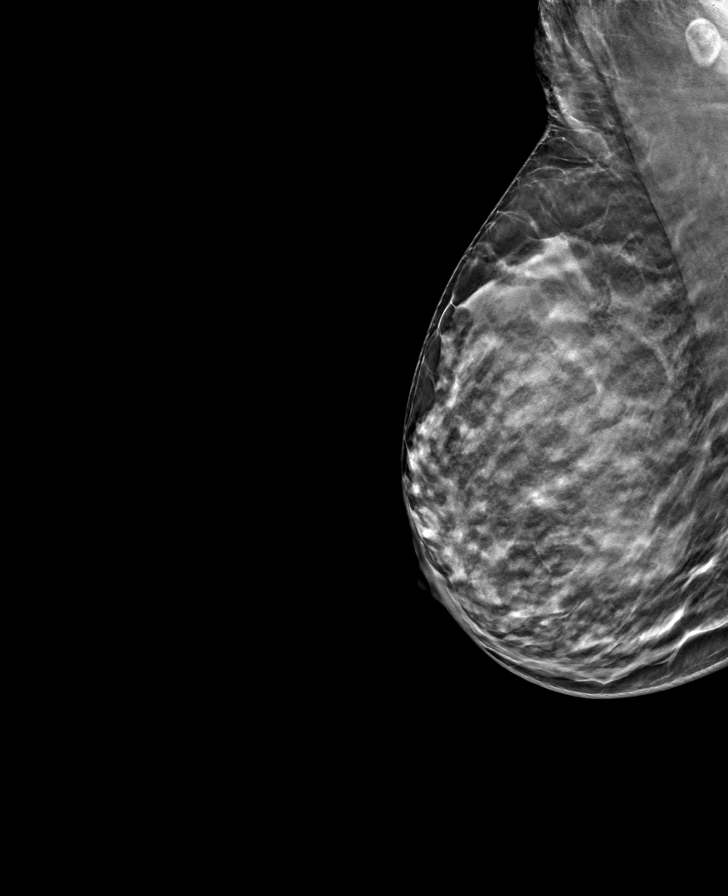

[8 of 24 positions shown; findings below may reference images not displayed]

ACR Breast Density Category c: The breast tissue is heterogeneously
dense, which may obscure small masses.
FINDINGS: There are no findings suspicious for malignancy.
IMPRESSION: No mammographic evidence of malignancy. A result letter of this
screening mammogram will be mailed directly to the patient.

RECOMMENDATION:
Screening mammogram in one year. (Code:Q3-W-BC3)

BI-RADS CATEGORY  1: Negative.

## 2022-06-03 ENCOUNTER — Ambulatory Visit (INDEPENDENT_AMBULATORY_CARE_PROVIDER_SITE_OTHER): Payer: Medicare Other | Admitting: Primary Care

## 2022-06-05 LAB — COLOGUARD: COLOGUARD: NEGATIVE

## 2022-06-18 ENCOUNTER — Ambulatory Visit (INDEPENDENT_AMBULATORY_CARE_PROVIDER_SITE_OTHER): Payer: Medicare Other | Admitting: Primary Care

## 2022-06-18 ENCOUNTER — Encounter (INDEPENDENT_AMBULATORY_CARE_PROVIDER_SITE_OTHER): Payer: Self-pay | Admitting: Primary Care

## 2022-06-18 VITALS — BP 114/70 | HR 75 | Resp 16 | Wt 108.2 lb

## 2022-06-18 DIAGNOSIS — E7841 Elevated Lipoprotein(a): Secondary | ICD-10-CM | POA: Diagnosis not present

## 2022-06-18 DIAGNOSIS — Z23 Encounter for immunization: Secondary | ICD-10-CM | POA: Diagnosis not present

## 2022-06-18 DIAGNOSIS — M810 Age-related osteoporosis without current pathological fracture: Secondary | ICD-10-CM | POA: Diagnosis not present

## 2022-06-18 MED ORDER — ALENDRONATE SODIUM 70 MG PO TABS: 70.0000 mg | ORAL_TABLET | ORAL | 1 refills | Status: DC

## 2022-06-18 NOTE — Progress Notes (Signed)
[name], is a 67 y.o. female  [id number]  [id number]  DOB - 04-13-1955  Chief Complaint  Patient presents with   Hyperlipidemia       Subjective:   Rachael Smith is a 67 y.o. female here today for a follow up visit on hyperlipidemia.  Patient has No headache, No chest pain, No abdominal pain - No Nausea, No new weakness tingling or numbness, No Cough - shortness of breath  No problems updated.  Allergies  Allergen Reactions   Ibuprofen Swelling   Zyrtec [Cetirizine]     Mouth feels funny    Past Medical History:  Diagnosis Date   Hypertension     Current Outpatient Medications on File Prior to Visit  Medication Sig Dispense Refill   amLODipine (NORVASC) 10 MG tablet Take 1 tablet (10 mg total) by mouth daily. 90 tablet 1   carvedilol (COREG) 6.25 MG tablet Take 1 tablet (6.25 mg total) by mouth 2 (two) times daily with a meal. 180 tablet 1   chlorthalidone (HYGROTON) 25 MG tablet Take 1 tablet (25 mg total) by mouth daily. 90 tablet 1   diphenhydrAMINE (BENADRYL) 25 MG tablet Take 1 tablet (25 mg total) by mouth every 6 (six) hours. (Patient taking differently: Take 25 mg by mouth every 6 (six) hours as needed.) 20 tablet 0   nitroGLYCERIN (NITROSTAT) 0.4 MG SL tablet Place 1 tablet (0.4 mg total) under the tongue every 5 (five) minutes as needed for chest pain. 25 tablet 2   pravastatin (PRAVACHOL) 20 MG tablet Take 1 tablet (20 mg total) by mouth every evening. 90 tablet 1   valsartan (DIOVAN) 160 MG tablet Take 1 tablet (160 mg total) by mouth daily. 90 tablet 1   No current facility-administered medications on file prior to visit.    Objective:   Vitals:   06/18/22 1102  BP: 114/70  Pulse: 75  Resp: 16  SpO2: 96%  Weight: 108 lb 3.2 oz (49.1 kg)    Comprehensive ROS Pertinent positive and negative noted in HPI   Exam General appearance : Awake, alert, not in any distress. Speech Clear. Not toxic  looking HEENT: Atraumatic and Normocephalic, pupils equally reactive to light and accomodation Neck: Supple, no JVD. No cervical lymphadenopathy.  Chest: Good air entry bilaterally, no added sounds  CVS: S1 S2 regular, no murmurs.  Abdomen: Bowel sounds present, Non tender and not distended with no gaurding, rigidity or rebound. Extremities: B/L Lower Ext shows no edema, both legs are warm to touch Neurology: Awake alert, and oriented X 3, CN II-XII intact, Non focal Skin: No Rash  Data Review No results found for: "HGBA1C"  [place] was seen today for hyperlipidemia.  Diagnoses and all orders for this visit:  Age-related osteoporosis without current pathological fracture 2/2 Other orders -     alendronate (FOSAMAX) 70 MG tablet; Take 1 tablet (70 mg total) by mouth once a week. Take with a full glass of water on an empty stomach.   Elevated lipoprotein(a) -     Lipid Panel  Need for pneumococcal vaccine -     Pneumococcal conjugate vaccine 20-valent      Patient have been counseled extensively about nutrition and exercise. Other issues discussed during this visit include: low cholesterol diet, weight control and daily exercise, foot care, annual eye examinations at Ophthalmology, importance of adherence with medications and regular follow-up. We also discussed long term complications of uncontrolled diabetes and hypertension.  No follow-ups on file.  The patient was given clear instructions to go to ER or return to medical center if symptoms don't improve, worsen or new problems develop. The patient verbalized understanding. The patient was told to call to get lab results if they haven't heard anything in the next week.   This note has been created with Surveyor, quantity. Any transcriptional errors are unintentional.   Kerin Perna, NP 06/21/2022, 11:27 PM

## 2022-06-19 LAB — LIPID PANEL
Chol/HDL Ratio: 4.8 ratio — ABNORMAL HIGH (ref 0.0–4.4)
Cholesterol, Total: 276 mg/dL — ABNORMAL HIGH (ref 100–199)
HDL: 58 mg/dL (ref >39)
LDL Chol Calc (NIH): 194 mg/dL — ABNORMAL HIGH (ref 0–99)
Triglycerides: 135 mg/dL (ref 0–149)
VLDL Cholesterol Cal: 24 mg/dL (ref 5–40)

## 2022-06-21 ENCOUNTER — Encounter (INDEPENDENT_AMBULATORY_CARE_PROVIDER_SITE_OTHER): Payer: Self-pay | Admitting: Primary Care

## 2022-06-22 ENCOUNTER — Telehealth (INDEPENDENT_AMBULATORY_CARE_PROVIDER_SITE_OTHER): Payer: Self-pay

## 2022-06-22 NOTE — Telephone Encounter (Signed)
Error

## 2022-06-23 ENCOUNTER — Telehealth (HOSPITAL_BASED_OUTPATIENT_CLINIC_OR_DEPARTMENT_OTHER): Payer: Self-pay

## 2022-06-23 NOTE — Telephone Encounter (Addendum)
Left message for patient to call back    ----- Message from Loel Dubonnet, NP sent at 06/23/2022  1:29 PM EDT ----- Cholesterol panel not at goal and numbers going the wrong direction. Recommend either (1) change pravastatin to rosuvastatin 20mg  daily which is a more potent cholesterol medication or (2) increase pravastatin to 40mg  daily if she prefers  Regardless, repeat FLP/LFT 3 months after medication change.

## 2022-06-25 ENCOUNTER — Encounter (INDEPENDENT_AMBULATORY_CARE_PROVIDER_SITE_OTHER): Payer: Self-pay

## 2022-06-25 NOTE — Telephone Encounter (Signed)
2nd attempt, no answer, Left message for patient to call back        ----- Message from Loel Dubonnet, NP sent at 06/23/2022  1:29 PM EDT ----- Cholesterol panel not at goal and numbers going the wrong direction. Recommend either (1) change pravastatin to rosuvastatin 20mg  daily which is a more potent cholesterol medication or (2) increase pravastatin to 40mg  daily if she prefers   Regardless, repeat FLP/LFT 3 months after medication change.

## 2022-06-26 ENCOUNTER — Encounter (HOSPITAL_BASED_OUTPATIENT_CLINIC_OR_DEPARTMENT_OTHER): Payer: Self-pay

## 2022-06-26 NOTE — Telephone Encounter (Signed)
3rd call attempt, no answer, Left message for patient to call back, results mailed to patient with request that she call the office with her decision.          ----- Message from Alver Sorrow, NP sent at 06/23/2022  1:29 PM EDT ----- Cholesterol panel not at goal and numbers going the wrong direction. Recommend either (1) change pravastatin to rosuvastatin 20mg  daily which is a more potent cholesterol medication or (2) increase pravastatin to 40mg  daily if she prefers   Regardless, repeat FLP/LFT 3 months after medication change.

## 2022-09-01 ENCOUNTER — Telehealth (INDEPENDENT_AMBULATORY_CARE_PROVIDER_SITE_OTHER): Payer: Self-pay | Admitting: Primary Care

## 2022-09-01 NOTE — Telephone Encounter (Signed)
Left VM with pt.

## 2022-09-17 ENCOUNTER — Ambulatory Visit (INDEPENDENT_AMBULATORY_CARE_PROVIDER_SITE_OTHER): Payer: Medicare Other | Admitting: Primary Care

## 2022-09-17 VITALS — BP 137/84 | HR 66 | Ht 62.0 in | Wt 111.8 lb

## 2022-09-17 DIAGNOSIS — I10 Essential (primary) hypertension: Secondary | ICD-10-CM

## 2022-09-17 DIAGNOSIS — Z111 Encounter for screening for respiratory tuberculosis: Secondary | ICD-10-CM | POA: Diagnosis not present

## 2022-09-17 NOTE — Progress Notes (Signed)
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

## 2022-09-18 ENCOUNTER — Ambulatory Visit (INDEPENDENT_AMBULATORY_CARE_PROVIDER_SITE_OTHER): Payer: Medicare Other | Admitting: Primary Care

## 2022-09-19 ENCOUNTER — Encounter (INDEPENDENT_AMBULATORY_CARE_PROVIDER_SITE_OTHER): Payer: Self-pay | Admitting: Primary Care

## 2022-09-19 NOTE — Progress Notes (Signed)
[facility]  Rachael Smith, is a 67 y.o. female  [id number]  [id number]  DOB - [birth year]  Chief Complaint  Patient presents with   Hypertension       Subjective:   Rachael Smith is a 67 y.o. female here today for a follow up visit HTN. Patient has No headache, No chest pain, No abdominal pain - No Nausea, No new weakness tingling or numbness, No Cough - shortness of breath.  Patient is also requesting a PPD and a form filled out for employment.  She will have to return next week for PPD reading.  No problems updated.  Allergies  Allergen Reactions   Ibuprofen Swelling   Zyrtec [Cetirizine]     Mouth feels funny    Past Medical History:  Diagnosis Date   Hypertension     Current Outpatient Medications on File Prior to Visit  Medication Sig Dispense Refill   alendronate (FOSAMAX) 70 MG tablet Take 1 tablet (70 mg total) by mouth once a week. Take with a full glass of water on an empty stomach. 12 tablet 1   amLODipine (NORVASC) 10 MG tablet Take 1 tablet (10 mg total) by mouth daily. 90 tablet 1   carvedilol (COREG) 6.25 MG tablet Take 1 tablet (6.25 mg total) by mouth 2 (two) times daily with a meal. 180 tablet 1   chlorthalidone (HYGROTON) 25 MG tablet Take 1 tablet (25 mg total) by mouth daily. 90 tablet 1   nitroGLYCERIN (NITROSTAT) 0.4 MG SL tablet Place 1 tablet (0.4 mg total) under the tongue every 5 (five) minutes as needed for chest pain. 25 tablet 2   pravastatin (PRAVACHOL) 20 MG tablet Take 1 tablet (20 mg total) by mouth every evening. 90 tablet 1   valsartan (DIOVAN) 160 MG tablet Take 1 tablet (160 mg total) by mouth daily. 90 tablet 1   diphenhydrAMINE (BENADRYL) 25 MG tablet Take 1 tablet (25 mg total) by mouth every 6 (six) hours. (Patient taking differently: Take 25 mg by mouth every 6 (six) hours as needed.) 20 tablet 0   No current facility-administered medications on file prior to visit.    Objective:   Vitals:    09/17/22 1029  BP: 137/84  Pulse: 66  SpO2: 100%  Weight: 111 lb 12.8 oz (50.7 kg)  Height: 5\' 2"  (1.575 m)    Comprehensive ROS Pertinent positive and negative noted in HPI   Exam General appearance : Awake, alert, not in any distress. Speech Clear. Not toxic looking HEENT: Atraumatic and Normocephalic, pupils equally reactive to light and accomodation Neck: Supple, no JVD. No cervical lymphadenopathy.  Chest: Good air entry bilaterally, no added sounds  CVS: S1 S2 regular, no murmurs.  Abdomen: Bowel sounds present, Non tender and not distended with no gaurding, rigidity or rebound. Extremities: B/L Lower Ext shows no edema, both legs are warm to touch Neurology: Awake alert, and oriented X 3, CN II-XII intact, Non focal Skin: No Rash  Data Review No results found for: "HGBA1C"  Assessment & Plan  Rachael Smith was seen today for hypertension.  Diagnoses and all orders for this visit:  Screening-pulmonary TB Patient to return next week for PPD-     Essential hypertension Blood pressure well-controlled 137/84  We discussed the importance of compliance with medical therapy and DASH diet recommended, consequences of uncontrolled hypertension discussed.  - continue current BP medications      Patient have been counseled extensively about nutrition and exercise. Other issues discussed during this  visit include: low cholesterol diet, weight control and daily exercise, foot care, annual eye examinations at Ophthalmology, importance of adherence with medications and regular follow-up. We also discussed long term complications of uncontrolled diabetes and hypertension.   No follow-ups on file.  The patient was given clear instructions to go to ER or return to medical center if symptoms don't improve, worsen or new problems develop. The patient verbalized understanding. The patient was told to call to get lab results if they haven't heard anything in the next week.   This note has  been created with Education officer, environmental. Any transcriptional errors are unintentional.   Grayce Sessions, NP 09/19/2022, 11:30 PM

## 2022-09-21 ENCOUNTER — Ambulatory Visit (INDEPENDENT_AMBULATORY_CARE_PROVIDER_SITE_OTHER): Payer: Medicare Other

## 2022-09-26 ENCOUNTER — Other Ambulatory Visit: Payer: Self-pay | Admitting: Cardiology

## 2022-09-26 DIAGNOSIS — E7800 Pure hypercholesterolemia, unspecified: Secondary | ICD-10-CM

## 2022-09-28 ENCOUNTER — Ambulatory Visit (INDEPENDENT_AMBULATORY_CARE_PROVIDER_SITE_OTHER): Payer: Medicare Other

## 2022-09-28 ENCOUNTER — Other Ambulatory Visit: Payer: Self-pay | Admitting: Cardiology

## 2022-09-28 DIAGNOSIS — I10 Essential (primary) hypertension: Secondary | ICD-10-CM

## 2022-09-28 DIAGNOSIS — Z111 Encounter for screening for respiratory tuberculosis: Secondary | ICD-10-CM

## 2022-09-28 NOTE — Progress Notes (Signed)
Tuberculin skin test applied to right ventral forearm.  Patient informed to schedule appt for nurse visit in 48-72 hours to have site read.  Jay'A R Robertson, RMA  

## 2022-09-28 NOTE — Telephone Encounter (Signed)
Rx request sent to pharmacy.  

## 2022-09-28 NOTE — Telephone Encounter (Signed)
Rx(s) sent to pharmacy electronically.  

## 2022-09-30 ENCOUNTER — Encounter (INDEPENDENT_AMBULATORY_CARE_PROVIDER_SITE_OTHER): Payer: Self-pay

## 2022-09-30 LAB — TB SKIN TEST
Induration: 0 mm
TB Skin Test: NEGATIVE

## 2022-12-02 ENCOUNTER — Other Ambulatory Visit (INDEPENDENT_AMBULATORY_CARE_PROVIDER_SITE_OTHER): Payer: Self-pay | Admitting: Primary Care

## 2022-12-24 ENCOUNTER — Other Ambulatory Visit: Payer: Self-pay | Admitting: Cardiology

## 2022-12-24 DIAGNOSIS — I10 Essential (primary) hypertension: Secondary | ICD-10-CM

## 2023-03-05 ENCOUNTER — Other Ambulatory Visit: Payer: Self-pay | Admitting: Cardiology

## 2023-03-05 DIAGNOSIS — E7800 Pure hypercholesterolemia, unspecified: Secondary | ICD-10-CM

## 2023-04-29 ENCOUNTER — Encounter (INDEPENDENT_AMBULATORY_CARE_PROVIDER_SITE_OTHER): Payer: Self-pay | Admitting: *Deleted

## 2023-06-18 ENCOUNTER — Other Ambulatory Visit: Payer: Self-pay | Admitting: Cardiology

## 2024-01-25 ENCOUNTER — Telehealth (INDEPENDENT_AMBULATORY_CARE_PROVIDER_SITE_OTHER): Payer: Self-pay | Admitting: Primary Care

## 2024-01-25 NOTE — Telephone Encounter (Signed)
 Left VM with pt about their upcoming appt. Pt did not answer

## 2024-01-27 ENCOUNTER — Encounter (INDEPENDENT_AMBULATORY_CARE_PROVIDER_SITE_OTHER): Payer: Self-pay | Admitting: Primary Care

## 2024-01-27 ENCOUNTER — Ambulatory Visit (INDEPENDENT_AMBULATORY_CARE_PROVIDER_SITE_OTHER): Admitting: Primary Care

## 2024-01-27 VITALS — BP 126/74 | HR 72 | Resp 16 | Ht 63.0 in | Wt 106.3 lb

## 2024-01-27 DIAGNOSIS — F411 Generalized anxiety disorder: Secondary | ICD-10-CM | POA: Diagnosis not present

## 2024-01-27 DIAGNOSIS — F430 Acute stress reaction: Secondary | ICD-10-CM

## 2024-01-27 DIAGNOSIS — Z1231 Encounter for screening mammogram for malignant neoplasm of breast: Secondary | ICD-10-CM | POA: Diagnosis not present

## 2024-01-27 DIAGNOSIS — I10 Essential (primary) hypertension: Secondary | ICD-10-CM

## 2024-01-27 NOTE — Progress Notes (Signed)
 [facility]  Rachael Smith, is a 68 y.o. female  [id number]  [id number]  DOB - 11/18/1955  Chief Complaint  Patient presents with   Hypertension   Stress    Pt states she is taking her care of niece because she had a stroke        Subjective:   Rachael Smith is a 68 y.o. female here today for a follow up visit for the management of hypertension patient has intermittent headache, No chest pain, No abdominal pain - No Nausea, No new weakness tingling or numbness, No Cough - shortness of breath. Her stress level is elevated her niece had a stroke and now she has been  being  her care giver.  Therefore, she has been neglecting herself and her care.  Reviewed high blood pressure medication has not been taking it.  On medication her blood pressure was well-controlled.  Today she will be treated for hypertension urgency 162/118 secondary to stress and hypertension Advised patient to call me if these provide and asked for an evaluation for home health assistance distress there is 24 hours 7 days a week care.  Patient  is assured PCP she would call for ADL assistance. Explained unable to reach out to her provider she is not my patient. No problems updated.  Comprehensive ROS Pertinent positive and negative noted in HPI   Allergies  Allergen Reactions   Ibuprofen Swelling   Zyrtec  [Cetirizine ]     Mouth feels funny    Past Medical History:  Diagnosis Date   Hypertension     Current Outpatient Medications on File Prior to Visit  Medication Sig Dispense Refill   alendronate  (FOSAMAX ) 70 MG tablet TAKE 1 TABLET BY MOUTH ONCE A WEEK WITH A FULL GLASS OF WATER ON AN EMPTY STOMACH. 12 tablet 1   amLODipine  (NORVASC ) 10 MG tablet TAKE 1 TABLET BY MOUTH EVERY DAY 90 tablet 1   carvedilol  (COREG ) 6.25 MG tablet TAKE 1 TABLET BY MOUTH 2 TIMES DAILY WITH A MEAL. 180 tablet 3   chlorthalidone  (HYGROTON ) 25 MG tablet TAKE 1 TABLET (25 MG TOTAL) BY MOUTH DAILY. 90  tablet 1   diphenhydrAMINE  (BENADRYL ) 25 MG tablet Take 1 tablet (25 mg total) by mouth every 6 (six) hours. (Patient taking differently: Take 25 mg by mouth every 6 (six) hours as needed.) 20 tablet 0   nitroGLYCERIN  (NITROSTAT ) 0.4 MG SL tablet Place 1 tablet (0.4 mg total) under the tongue every 5 (five) minutes as needed for chest pain. 25 tablet 2   pravastatin  (PRAVACHOL ) 20 MG tablet TAKE 1 TABLET BY MOUTH EVERY DAY IN THE EVENING 90 tablet 1   valsartan  (DIOVAN ) 160 MG tablet TAKE 1 TABLET BY MOUTH EVERY DAY 90 tablet 3   No current facility-administered medications on file prior to visit.   Health Maintenance  Topic Date Due   Medicare Annual Wellness Visit  03/05/2023   Flu Shot  10/22/2023   COVID-19 Vaccine (4 - 2025-26 season) 11/22/2023   Breast Cancer Screening  02/21/2024   Cologuard (Stool DNA test)  05/26/2025   DTaP/Tdap/Td vaccine (2 - Td or Tdap) 12/01/2026   Pneumococcal Vaccine for age over 51  Completed   DEXA scan (bone density measurement)  Completed   Hepatitis C Screening  Completed   Zoster (Shingles) Vaccine  Completed   Meningitis B Vaccine  Aged Out    Objective:   Vitals:   01/27/24 1039 01/27/24 1040 01/27/24 1114 01/27/24 1453  BP: (!) 198/107 ROLLEN)  203/103 (!) 190/100 126/74  Pulse: 72     Resp: 16     SpO2: 97%     Weight: 106 lb 4.8 oz (48.2 kg)     Height: 5' 3 (1.6 m)      BP Readings from Last 3 Encounters:  01/27/24 126/74  09/17/22 137/84  06/18/22 114/70      Physical Exam Vitals reviewed.  Constitutional:      Appearance: Normal appearance.  HENT:     Head: Normocephalic.     Right Ear: Tympanic membrane, ear canal and external ear normal.     Left Ear: Tympanic membrane, ear canal and external ear normal.     Nose: Nose normal.     Mouth/Throat:     Mouth: Mucous membranes are moist.  Eyes:     Extraocular Movements: Extraocular movements intact.     Pupils: Pupils are equal, round, and reactive to light.   Cardiovascular:     Rate and Rhythm: Normal rate and regular rhythm.  Pulmonary:     Effort: Pulmonary effort is normal.     Breath sounds: Normal breath sounds.  Abdominal:     General: Bowel sounds are normal.     Palpations: Abdomen is soft.  Musculoskeletal:        General: Normal range of motion.     Cervical back: Normal range of motion.  Skin:    General: Skin is warm and dry.  Neurological:     Mental Status: She is alert and oriented to person, place, and time.  Psychiatric:        Mood and Affect: Mood normal.        Behavior: Behavior normal.        Thought Content: Thought content normal.     Assessment & Plan  Rachael Smith was seen today for hypertension and stress.  Diagnoses and all orders for this visit:  Encounter for screening mammogram for malignant neoplasm of breast -     MM 3D SCREENING MAMMOGRAM BILATERAL BREAST; Future  Essential hypertension Uncontrolled  BP goal - < 140/90 Explained that having normal blood pressure is the goal and medications are helping to get to goal and maintain normal blood pressure. DIET: Limit salt intake, read nutrition labels to check salt content, limit fried and high fatty foods  Avoid using multisymptom OTC cold preparations that generally contain sudafed which can rise BP. Consult with pharmacist on best cold relief products to use for persons with HTN EXERCISE Discussed incorporating exercise such as walking - 30 minutes most days of the week and can do in 10 minute intervals       Patient have been counseled extensively about nutrition and exercise. Other issues discussed during this visit include: low cholesterol diet, weight control and daily exercise, foot care, annual eye examinations at Ophthalmology, importance of adherence with medications and regular follow-up. We also discussed long term complications of uncontrolled diabetes and hypertension.   Anxiety as acute reaction to exceptional stress  See HPI  Return in  about 4 weeks (around 02/24/2024) for stress .  The patient was given clear instructions to go to ER or return to medical center if symptoms don't improve, worsen or new problems develop. The patient verbalized understanding. The patient was told to call to get lab results if they haven't heard anything in the next week.   This note has been created with Education officer, environmental. Any transcriptional errors are unintentional.   Rachael  SHAUNNA Bohr, Rachael Smith 02/01/2024, 5:25 PM

## 2024-02-01 ENCOUNTER — Encounter (INDEPENDENT_AMBULATORY_CARE_PROVIDER_SITE_OTHER): Payer: Self-pay | Admitting: Primary Care

## 2024-02-01 ENCOUNTER — Other Ambulatory Visit: Payer: Self-pay | Admitting: Pharmacist

## 2024-02-01 NOTE — Progress Notes (Signed)
 Pharmacy Quality Measure Review  This patient is appearing on a report for being at risk of failing the adherence measure for hypertension (ACEi/ARB) medications this calendar year.   Medication: valsartan  Last fill date: 10/02/23 for 90 day supply  Contacted pharmacy to facilitate refills.  Herlene Fleeta Morris, PharmD, JAQUELINE, CPP Clinical Pharmacist [facility] [phone]

## 2024-02-09 ENCOUNTER — Other Ambulatory Visit: Payer: Self-pay | Admitting: Pharmacist

## 2024-02-09 NOTE — Progress Notes (Signed)
 Pharmacy Quality Measure Review  This patient is appearing on a report for being at risk of failing the adherence measure for hypertension (ACEi/ARB) medications this calendar year.   Medication: valsartan  Last fill date: 02/01/24 for 90 day supply, however, it has not yet been dispensed.   I called the patient but was unable to connect. Reminder set to check again later this week.   Herlene Fleeta Morris, PharmD, JAQUELINE, CPP Clinical Pharmacist [facility] [phone]

## 2024-02-11 ENCOUNTER — Other Ambulatory Visit: Payer: Self-pay | Admitting: Pharmacist

## 2024-02-11 NOTE — Progress Notes (Signed)
 Pharmacy Quality Measure Review  This patient is appearing on a report for being at risk of failing the adherence measure for hypertension (ACEi/ARB) medications this calendar year.   Medication: valsartan  Last fill date: 02/01/24 for 90 day supply, however, it has not yet been dispensed.   I called the patient but was unable to connect. Reminder set to check again later this week.   Herlene Fleeta Morris, PharmD, JAQUELINE, CPP Clinical Pharmacist [facility] [phone]

## 2024-02-15 ENCOUNTER — Telehealth: Payer: Self-pay | Admitting: Cardiology

## 2024-02-15 DIAGNOSIS — I10 Essential (primary) hypertension: Secondary | ICD-10-CM

## 2024-02-15 DIAGNOSIS — E7800 Pure hypercholesterolemia, unspecified: Secondary | ICD-10-CM

## 2024-02-15 MED ORDER — CARVEDILOL 6.25 MG PO TABS: 6.2500 mg | ORAL_TABLET | Freq: Two times a day (BID) | ORAL | 1 refills | Status: AC

## 2024-02-15 MED ORDER — PRAVASTATIN SODIUM 20 MG PO TABS: 20.0000 mg | ORAL_TABLET | Freq: Every day | ORAL | 1 refills | Status: AC

## 2024-02-15 NOTE — Telephone Encounter (Signed)
 Refills has been sent to the pharmacy.

## 2024-02-15 NOTE — Telephone Encounter (Signed)
*  STAT* If patient is at the pharmacy, call can be transferred to refill team.   1. Which medications need to be refilled? (please list name of each medication and dose if known)   pravastatin  (PRAVACHOL ) 20 MG tablet  carvedilol  (COREG ) 6.25 MG tablet    2. Would you like to learn more about the convenience, safety, & potential cost savings by using the [facility]? no   3. Are you open to using the [facility] (Type [facility]. ). No    4. Which pharmacy/location (including street and city if local pharmacy) is medication to be sent to?[id number] - [place], [place] - [address]    5. Do they need a 30 day or 90 day supply? 90 day    Pt is out of medication

## 2024-04-26 NOTE — Progress Notes (Incomplete)
" °  Cardiology Office Note:  .   Date:  04/26/2024  ID:  Rachael Smith, DOB Jan 28, 1956, MRN [id number] PCP: Rachael Rosaline SQUIBB, NP  [facility] Providers Cardiologist:  Shelda Bruckner, MD {  History of Present Illness: .   Rachael Smith is a 69 y.o. female with a hx of hypertension who is seen in follow up. Initial consult note 04/19/18. Rachael Smith She has not been seen in cardiology since 05/07/2022.  Pertinent CV history: seen initially for chest pain. ETT negative. Followed up for elevated BP in HTN clinic and with me 06/2018.   Today:   ROS: Denies chest pain, shortness of breath at rest or with normal exertion. No PND, orthopnea, LE edema or unexpected weight gain. No syncope or palpitations. ROS otherwise negative except as noted.   Studies Reviewed: Rachael Smith    EKG:       Physical Exam:   VS:  There were no vitals taken for this visit.   Wt Readings from Last 3 Encounters:  01/27/24 106 lb 4.8 oz (48.2 kg)  09/17/22 111 lb 12.8 oz (50.7 kg)  06/18/22 108 lb 3.2 oz (49.1 kg)    GEN: Well nourished, well developed in no acute distress HEENT: Normal, moist mucous membranes NECK: No JVD CARDIAC: regular rhythm, normal S1 and S2, no rubs or gallops. No murmur. VASCULAR: Radial and DP pulses 2+ bilaterally. No carotid bruits RESPIRATORY:  Clear to auscultation without rales, wheezing or rhonchi  ABDOMEN: Soft, non-tender, non-distended MUSCULOSKELETAL:  Ambulates independently SKIN: Warm and dry, no edema NEUROLOGIC:  Alert and oriented x 3. No focal neuro deficits noted. PSYCHIATRIC:  Normal affect    ASSESSMENT AND PLAN: .    Hypertension: at goal, meds reviewed and tolerating well. -continue amlodipine  10 mg daily -continue carvedilol  6.25 mg BID -continue chlorthalidone  25 mg daily -continue valsartan  160 mg daily   Hypercholesterolemia: -on pravastatin  20 mg. Last LDL 169, not at goal. We have discussed intensifying based on her lipid results, she would prefer  to stay on current regimen  CV risk counseling and prevention -recommend heart healthy/Mediterranean diet, with whole grains, fruits, vegetable, fish, lean meats, nuts, and olive oil. Limit salt. -recommend moderate walking, 3-5 times/week for 30-50 minutes each session. Aim for at least 150 minutes/week. Goal should be pace of 3 miles/hours, or walking 1.5 miles in 30 minutes -recommend avoidance of tobacco products. Avoid excess alcohol. -ASCVD risk score: The ASCVD Risk score (Arnett DK, et al., 2019) failed to calculate for the following reasons:   According to ACC/AHA guidelines, patients with an LDL-C level greater than 190 mg/dL (5.08 mmol/L) should be considered for high-intensity statin therapy. This patient's most recent LDL-C level is 194 mg/dL.    Dispo: ***  Signed, Shelda Bruckner, MD   Shelda Bruckner, MD, PhD, [facility]  [facility]  [place]  [facility] at [facility] [address], [address] [place], [place] [address] [phone]   "

## 2024-04-27 ENCOUNTER — Ambulatory Visit (HOSPITAL_BASED_OUTPATIENT_CLINIC_OR_DEPARTMENT_OTHER): Admitting: Cardiology

## 2024-07-25 ENCOUNTER — Ambulatory Visit (HOSPITAL_BASED_OUTPATIENT_CLINIC_OR_DEPARTMENT_OTHER): Admitting: Cardiology
